# Patient Record
Sex: Female | Born: 1975 | Race: White | Hispanic: No | Marital: Married | Smoking: Never smoker
Health system: Southern US, Community
[De-identification: ages and names within clinical notes are randomized; demographics above are authoritative.]

## PROBLEM LIST (undated history)

## (undated) DIAGNOSIS — R51 Headache: Secondary | ICD-10-CM

## (undated) DIAGNOSIS — K219 Gastro-esophageal reflux disease without esophagitis: Secondary | ICD-10-CM

## (undated) DIAGNOSIS — T8859XA Other complications of anesthesia, initial encounter: Secondary | ICD-10-CM

## (undated) DIAGNOSIS — F32A Depression, unspecified: Secondary | ICD-10-CM

## (undated) DIAGNOSIS — T7840XA Allergy, unspecified, initial encounter: Secondary | ICD-10-CM

## (undated) HISTORY — DX: Gastro-esophageal reflux disease without esophagitis: K21.9

## (undated) HISTORY — PX: WISDOM TOOTH EXTRACTION: SHX21

## (undated) HISTORY — DX: Depression, unspecified: F32.A

## (undated) HISTORY — PX: APPENDECTOMY: SHX54

## (undated) HISTORY — DX: Allergy, unspecified, initial encounter: T78.40XA

## (undated) HISTORY — PX: ABDOMINAL HYSTERECTOMY: SHX81

---

## 1997-09-19 ENCOUNTER — Other Ambulatory Visit: Admission: RE | Admit: 1997-09-19 | Discharge: 1997-09-19 | Payer: Self-pay | Admitting: Obstetrics & Gynecology

## 1998-09-17 ENCOUNTER — Other Ambulatory Visit: Admission: RE | Admit: 1998-09-17 | Discharge: 1998-09-17 | Payer: Self-pay | Admitting: Obstetrics and Gynecology

## 1999-11-18 ENCOUNTER — Other Ambulatory Visit: Admission: RE | Admit: 1999-11-18 | Discharge: 1999-11-18 | Payer: Self-pay | Admitting: Obstetrics and Gynecology

## 2000-11-27 ENCOUNTER — Other Ambulatory Visit: Admission: RE | Admit: 2000-11-27 | Discharge: 2000-11-27 | Payer: Self-pay | Admitting: Obstetrics and Gynecology

## 2001-12-01 ENCOUNTER — Other Ambulatory Visit: Admission: RE | Admit: 2001-12-01 | Discharge: 2001-12-01 | Payer: Self-pay | Admitting: Obstetrics and Gynecology

## 2002-07-19 ENCOUNTER — Other Ambulatory Visit: Admission: RE | Admit: 2002-07-19 | Discharge: 2002-07-19 | Payer: Self-pay | Admitting: Obstetrics and Gynecology

## 2002-12-06 ENCOUNTER — Other Ambulatory Visit: Admission: RE | Admit: 2002-12-06 | Discharge: 2002-12-06 | Payer: Self-pay | Admitting: Obstetrics and Gynecology

## 2004-01-12 ENCOUNTER — Other Ambulatory Visit: Admission: RE | Admit: 2004-01-12 | Discharge: 2004-01-12 | Payer: Self-pay | Admitting: Obstetrics and Gynecology

## 2005-07-30 ENCOUNTER — Ambulatory Visit: Admission: RE | Admit: 2005-07-30 | Discharge: 2005-07-30 | Payer: Self-pay | Admitting: *Deleted

## 2005-09-10 ENCOUNTER — Ambulatory Visit (HOSPITAL_COMMUNITY): Admission: RE | Admit: 2005-09-10 | Discharge: 2005-09-10 | Payer: Self-pay | Admitting: Obstetrics and Gynecology

## 2005-10-25 ENCOUNTER — Inpatient Hospital Stay (HOSPITAL_COMMUNITY): Admission: AD | Admit: 2005-10-25 | Discharge: 2005-10-29 | Payer: Self-pay | Admitting: Obstetrics and Gynecology

## 2005-10-30 ENCOUNTER — Encounter: Admission: RE | Admit: 2005-10-30 | Discharge: 2005-11-29 | Payer: Self-pay | Admitting: Obstetrics and Gynecology

## 2010-09-23 ENCOUNTER — Other Ambulatory Visit: Payer: Self-pay | Admitting: Obstetrics and Gynecology

## 2011-06-03 ENCOUNTER — Other Ambulatory Visit: Payer: Self-pay | Admitting: Obstetrics and Gynecology

## 2011-06-25 ENCOUNTER — Encounter (HOSPITAL_COMMUNITY): Payer: Self-pay | Admitting: *Deleted

## 2011-06-25 ENCOUNTER — Inpatient Hospital Stay (HOSPITAL_COMMUNITY)
Admission: AD | Admit: 2011-06-25 | Discharge: 2011-06-25 | Disposition: A | Discharge status: Hospice | Payer: BC Managed Care – PPO | Source: Ambulatory Visit | Attending: Obstetrics and Gynecology | Admitting: Obstetrics and Gynecology

## 2011-06-25 DIAGNOSIS — IMO0002 Reserved for concepts with insufficient information to code with codable children: Secondary | ICD-10-CM | POA: Insufficient documentation

## 2011-06-25 HISTORY — DX: Headache: R51

## 2011-06-25 NOTE — MAU Provider Note (Signed)
Chief Complaint:  Vaginal Bleeding    First Provider Initiated Contact with Patient 06/25/11 2218      Felicia Roberson is  36 y.o. G1P0101.  No LMP recorded. Patient has had a hysterectomy..    She presents complaining of Vaginal Bleeding Pt is s/p LAVH & BSO by Dr. Henderson Cloud on 06/03/11. Reports no surgical or post-op complications until 2 hours ago when she noticed sudden onset of BRB from vagina. Reports bleeding through 2 panty liners and a regular maxi pad. States she passed a golfball sized clot in MAU bathroom. Since passing clot, bleeding has been minimal.   Denies fever, chills, nausea, vomiting, heavy lifting, recent vaginal penetration, or trauma Obstetrical/Gynecological History: OB History    Grav Para Term Preterm Abortions TAB SAB Ect Mult Living   1 1  1      1       Past Medical History: Past Medical History  Diagnosis Date  . Headache     Past Surgical History: Past Surgical History  Procedure Date  . Abdominal hysterectomy   . Cesarean section   . Appendectomy   . Wisdom tooth extraction     Family History: Family History  Problem Relation Age of Onset  . Arthritis Mother   . Hypertension Mother   . Heart disease Father   . COPD Father   . Stroke Maternal Grandmother     Social History: History  Substance Use Topics  . Smoking status: Never Smoker   . Smokeless tobacco: Not on file  . Alcohol Use: No    Allergies:  Allergies  Allergen Reactions  . Codeine Nausea And Vomiting  . Amoxicillin Rash    Prescriptions prior to admission  Medication Sig Dispense Refill  . ciprofloxacin (CIPRO) 500 MG tablet Take 500 mg by mouth 2 (two) times daily.      Marland Kitchen esomeprazole (NEXIUM) 10 MG packet Take 10 mg by mouth daily before breakfast. Acid reflux      . ibuprofen (ADVIL,MOTRIN) 200 MG tablet Take 200 mg by mouth every 6 (six) hours as needed. Pain from surgery      . oxyCODONE-acetaminophen (PERCOCET) 5-325 MG per tablet Take 1 tablet by mouth every 4  (four) hours as needed. Pain from surgery        Review of Systems - Negative except what has been reviewed in the HPI  Physical Exam   Blood pressure 123/74, pulse 74, temperature 97.9 F (36.6 C), temperature source Oral, resp. rate 18, height 5\' 3"  (1.6 m), weight 161 lb (73.029 kg), SpO2 100.00%.  General: General appearance - alert, well appearing, and in no distress and oriented to person, place, and time Mental status - alert, oriented to person, place, and time, normal mood, behavior, speech, dress, motor activity, and thought processes, affect appropriate to mood Abdomen - soft, nontender, nondistended, no masses or organomegaly Sutures: C/D/I, well healed at umbilicus and pubis Focused Gynecological Exam: VULVA: normal appearing vulva with no masses, tenderness or lesions, blood stained, VAGINA: scant dark bleeding noted, cleared with single fox swab, CERVIX: surgically absent, UTERUS: surgically absent, cuff healing well, suture intact, no evidence of active bleeding noted, ADNEXA: surgically absent bilateral  MD Consult: Discussed with Dr. Arelia Sneddon. Given hemostasis and stable state, patient to FU in office tomorrow to see Dr. Henderson Cloud.  Assessment: Post-op bleeding: Stable  Plan: Discharge home Call office in the morning for FU appt tomorrow with Dr. Pennie Banter E. 06/25/2011,10:22 PM

## 2011-06-25 NOTE — Discharge Instructions (Signed)
Postsurgical Bleeding You have developed bleeding after surgery. A little bleeding following surgery may be normal. Sometimes a second surgery to stop the bleeding is needed.  SYMPTOMS  The problems of bleeding following surgery depend on the amount and location of the bleeding:  Sometimes there is a little bleeding from a wound following surgery. This is extremely common. It is not usually problem.   Some surgeries will always have a small amount of bleeding following the surgery. An example of this would be a D & C (dilatation and curettage). This is a procedure where the inside of the uterus is scraped out. Because the surface is raw inside the uterus after the procedure, there is almost always some bleeding or oozing.   Occasionally there may be a small vessel that either breaks loose from a suture (stitch) or a vessel that was not bleeding during the procedure because of spasm in the vessel. Then when the spasm goes away after surgery, bleeding begins.   Bleeding into your brain after brain surgery happens occasionally and is very dangerous.  DIAGNOSIS  Your caregiver will often know what is wrong by examining you.  TREATMENT   The treatment of bleeding problems following surgery will depend on the amount and the location.   Your caregiver will decide if it is safe to watch this. If the problem does not get better, some additional surgery may be needed.   Worrisome bleeding may require faster action and more surgery. It may be necessary to take you immediately back to surgery if there is a sudden loss of blood pressure following surgery. There may not be time to consult with family, or even the patient, if the problems are sudden and severe.   A blood transfusion may be needed.  HOME CARE INSTRUCTIONS If you have questions about your care, discuss them with your caregiver. Make sure all of your questions are answered. SEEK MEDICAL CARE IF: Bleeding is increased, or there is increased  pain, swelling, heat or redness in the wound or you develop an unexplained temperature. Document Released: 03/14/2004 Document Revised: 12/12/2010 Document Reviewed: 11/19/2006 ExitCare Patient Information 2012 [employer]. 

## 2011-06-25 NOTE — MAU Note (Signed)
Pt had vaginal hysterectomy/laporscopic assisted on 28 May.  C/o mod amt bleeding at 1900 accompanied with low crampy abd pain.  Pt took 400mg  ibuprophen @ 1730 for mild low abd pain, that then worsened @ 1900.

## 2011-06-25 NOTE — MAU Note (Signed)
Pt reports she had a total hysterectomy on 05/28, today about 2 hours ago began having heavy vaginal bleeding. And cramping

## 2011-11-20 ENCOUNTER — Other Ambulatory Visit: Payer: Self-pay | Admitting: Obstetrics and Gynecology

## 2011-11-20 DIAGNOSIS — R1032 Left lower quadrant pain: Secondary | ICD-10-CM

## 2011-11-26 ENCOUNTER — Other Ambulatory Visit: Payer: BC Managed Care – PPO

## 2011-11-27 ENCOUNTER — Other Ambulatory Visit: Payer: BC Managed Care – PPO

## 2013-11-07 ENCOUNTER — Encounter (HOSPITAL_COMMUNITY): Payer: Self-pay | Admitting: *Deleted

## 2014-03-22 ENCOUNTER — Other Ambulatory Visit: Payer: Self-pay | Admitting: Obstetrics and Gynecology

## 2014-03-23 LAB — CYTOLOGY - PAP

## 2017-01-21 ENCOUNTER — Other Ambulatory Visit: Payer: Self-pay | Admitting: Obstetrics and Gynecology

## 2017-01-21 DIAGNOSIS — R928 Other abnormal and inconclusive findings on diagnostic imaging of breast: Secondary | ICD-10-CM

## 2017-01-30 ENCOUNTER — Ambulatory Visit
Admission: RE | Admit: 2017-01-30 | Discharge: 2017-01-30 | Disposition: A | Discharge status: Home / Self Care | Payer: Managed Care, Other (non HMO) | Source: Ambulatory Visit | Attending: Obstetrics and Gynecology | Admitting: Obstetrics and Gynecology

## 2017-01-30 DIAGNOSIS — R928 Other abnormal and inconclusive findings on diagnostic imaging of breast: Secondary | ICD-10-CM

## 2018-11-09 DIAGNOSIS — N3010 Interstitial cystitis (chronic) without hematuria: Secondary | ICD-10-CM | POA: Insufficient documentation

## 2018-11-09 DIAGNOSIS — A630 Anogenital (venereal) warts: Secondary | ICD-10-CM | POA: Insufficient documentation

## 2019-02-01 ENCOUNTER — Other Ambulatory Visit: Payer: Self-pay | Admitting: Obstetrics and Gynecology

## 2019-02-01 DIAGNOSIS — R928 Other abnormal and inconclusive findings on diagnostic imaging of breast: Secondary | ICD-10-CM

## 2019-02-11 ENCOUNTER — Other Ambulatory Visit: Payer: Self-pay | Admitting: Obstetrics and Gynecology

## 2019-02-11 ENCOUNTER — Ambulatory Visit
Admission: RE | Admit: 2019-02-11 | Discharge: 2019-02-11 | Disposition: A | Discharge status: Home / Self Care | Payer: BC Managed Care – PPO | Source: Ambulatory Visit | Attending: Obstetrics and Gynecology | Admitting: Obstetrics and Gynecology

## 2019-02-11 ENCOUNTER — Other Ambulatory Visit: Payer: Self-pay

## 2019-02-11 ENCOUNTER — Ambulatory Visit
Admission: RE | Admit: 2019-02-11 | Discharge: 2019-02-11 | Disposition: A | Discharge status: Home / Self Care | Payer: Managed Care, Other (non HMO) | Source: Ambulatory Visit | Attending: Obstetrics and Gynecology | Admitting: Obstetrics and Gynecology

## 2019-02-11 DIAGNOSIS — R928 Other abnormal and inconclusive findings on diagnostic imaging of breast: Secondary | ICD-10-CM

## 2019-02-24 ENCOUNTER — Ambulatory Visit
Admission: RE | Admit: 2019-02-24 | Discharge: 2019-02-24 | Disposition: A | Discharge status: Home / Self Care | Payer: BC Managed Care – PPO | Source: Ambulatory Visit | Attending: Obstetrics and Gynecology | Admitting: Obstetrics and Gynecology

## 2019-02-24 ENCOUNTER — Other Ambulatory Visit: Payer: Self-pay

## 2019-02-24 ENCOUNTER — Inpatient Hospital Stay: Admission: RE | Admit: 2019-02-24 | Payer: BC Managed Care – PPO | Source: Ambulatory Visit

## 2019-02-24 DIAGNOSIS — R928 Other abnormal and inconclusive findings on diagnostic imaging of breast: Secondary | ICD-10-CM

## 2019-03-18 ENCOUNTER — Ambulatory Visit: Payer: Self-pay | Admitting: Surgery

## 2019-03-18 DIAGNOSIS — N6489 Other specified disorders of breast: Secondary | ICD-10-CM

## 2019-03-18 NOTE — H&P (Signed)
Felicia Roberson Documented: 03/18/2019 9:16 AM Location: [facility] Patient #: [id number] DOB: [birth year] Married / Language: Cleophus Molt / Race: White Female  History of Present Illness Marcello Moores A. Amiera Herzberg MD; 03/18/2019 1:26 PM) Patient words: Patient sent at the request of Dr. Derrel Nip from the breast center of [facility] due to abnormal screening mammogram. The patient presents for a screening mammogram. She was found to have an area in the left breast upper outer quadrant with architectural distortion. Core biopsy showed a complex sclerosing lesion. The patient denies any history of breast pain, nipple discharge or mass in either breast. No family history of breast cancer.       CLINICAL DATA: Recall from screening mammography with tomosynthesis, possible architectural distortion involving the UPPER INNER QUADRANT of the LEFT breast at MIDDLE to POSTERIOR depth. The patient states that she has palpable thickening of the UPPER INNER QUADRANT of the LEFT breast that she has recently noticed.  EXAM: DIGITAL DIAGNOSTIC LEFT MAMMOGRAM WITH TOMO  ULTRASOUND LEFT BREAST  COMPARISON: Previous exam(s).  ACR Breast Density Category c: The breast tissue is heterogeneously dense, which may obscure small masses.  FINDINGS: Tomosynthesis and synthesized spot-compression CC and MLO views of the area of concern in the LEFT breast were obtained.  Architectural distortion persists in the [place] on the spot compression tomosynthesis images. There is no associated mass or suspicious calcifications.  On correlative physical exam, there is palpable thickening involving the UPPER INNER QUADRANT of the LEFT breast.  Targeted LEFT breast ultrasound is performed, showing benign clustered cysts at the 10 o'clock position approximately 4 cm from the nipple at POSTERIOR depth measuring approximately 0.4 x 1.1 x 1.2 cm, demonstrating posterior acoustic enhancement and no  internal power Doppler flow. This does not account for the distortion on mammography.  No convincing sonographic correlate for the distortion is identified.  Sonographic evaluation of the LEFT axilla demonstrates no pathologic lymphadenopathy.  IMPRESSION: 1. Persistent architectural distortion involving the UPPER INNER QUADRANT of the LEFT breast without sonographic correlate. 2. No pathologic LEFT axillary lymphadenopathy.  RECOMMENDATION: Stereotactic tomosynthesis core needle biopsy of the distortion in the UPPER INNER LEFT breast.  The stereotactic tomosynthesis core needle biopsy procedure was discussed with the patient and her questions were answered. She wishes to proceed, and the biopsy has been scheduled at her convenience.  I have discussed the findings and recommendations with the patient.  BI-RADS CATEGORY 4: Suspicious.        Diagnosis Breast, left, needle core biopsy, upper inner quadrant - COMPLEX SCLEROSING LESION. - FIBROCYSTIC CHANGE.  The patient is a 44 year old female.   Past Surgical History (Tanisha A. Owens Shark, [place]; 03/18/2019 9:16 AM) Appendectomy Breast Biopsy Left. Cesarean Section - 1 Hysterectomy (not due to cancer) - Complete  Diagnostic Studies History (Tanisha A. Owens Shark, [place]; 03/18/2019 9:16 AM) Colonoscopy 1-5 years ago Mammogram within last year Pap Smear 1-5 years ago  Allergies (Tanisha A. Owens Shark, [place]; 03/18/2019 9:17 AM) Amoxicillin *PENICILLINS* Codeine Phosphate *ANALGESICS - OPIOID* Allergies Reconciled  Medication History (Tanisha A. Owens Shark, [place]; 03/18/2019 9:18 AM) [place] Active. Vitamin D (50 MCG(2000 UT) Tablet, Oral) Active. Zinc (10MG  Tablet, Oral) Active. ZyrTEC Allergy (10MG  Tablet, Oral) Active. Multi-Vitamin (Oral) Active. Medications Reconciled  Social History (Tanisha A. Owens Shark, [place]; 03/18/2019 9:16 AM) No alcohol use No caffeine use No drug use Tobacco use Never smoker.  Family  History (Tanisha A. Owens Shark, [place]; 03/18/2019 9:16 AM) Arthritis Mother. Heart Disease Father. Hypertension Mother. Thyroid problems Mother.  Pregnancy / Birth History (Tanisha A. Manson Passey, RMA; 03/18/2019 9:16 AM) Age at menarche 12 years. Age of menopause <44 Contraceptive History Oral contraceptives. Gravida 1 Irregular periods Maternal age 23-30 Para 1  Other Problems (Tanisha A. Manson Passey, RMA; 03/18/2019 9:16 AM) Depression Migraine Headache     Review of Systems (Tanisha A. Brown RMA; 03/18/2019 9:16 AM) General Not Present- Appetite Loss, Chills, Fatigue, Fever, Night Sweats, Weight Gain and Weight Loss. Skin Not Present- Change in Wart/Mole, Dryness, Hives, Jaundice, New Lesions, Non-Healing Wounds, Rash and Ulcer. HEENT Present- Wears glasses/contact lenses. Not Present- Earache, Hearing Loss, Hoarseness, Nose Bleed, Oral Ulcers, Ringing in the Ears, Seasonal Allergies, Sinus Pain, Sore Throat, Visual Disturbances and Yellow Eyes. Respiratory Not Present- Bloody sputum, Chronic Cough, Difficulty Breathing, Snoring and Wheezing. Breast Present- Breast Mass. Not Present- Breast Pain, Nipple Discharge and Skin Changes. Cardiovascular Not Present- Chest Pain, Difficulty Breathing Lying Down, Leg Cramps, Palpitations, Rapid Heart Rate, Shortness of Breath and Swelling of Extremities. Gastrointestinal Not Present- Abdominal Pain, Bloating, Bloody Stool, Change in Bowel Habits, Chronic diarrhea, Constipation, Difficulty Swallowing, Excessive gas, Gets full quickly at meals, Hemorrhoids, Indigestion, Nausea, Rectal Pain and Vomiting. Female Genitourinary Not Present- Frequency, Nocturia, Painful Urination, Pelvic Pain and Urgency. Musculoskeletal Not Present- Back Pain, Joint Pain, Joint Stiffness, Muscle Pain, Muscle Weakness and Swelling of Extremities. Neurological Not Present- Decreased Memory, Fainting, Headaches, Numbness, Seizures, Tingling, Tremor, Trouble walking and  Weakness. Psychiatric Not Present- Anxiety, Bipolar, Change in Sleep Pattern, Depression, Fearful and Frequent crying. Endocrine Not Present- Cold Intolerance, Excessive Hunger, Hair Changes, Heat Intolerance, Hot flashes and New Diabetes. Hematology Not Present- Blood Thinners, Easy Bruising, Excessive bleeding, Gland problems, HIV and Persistent Infections.  Vitals (Tanisha A. Brown RMA; 03/18/2019 9:18 AM) 03/18/2019 9:18 AM Weight: 160.4 lb Height: 63in Body Surface Area: 1.76 m Body Mass Index: 28.41 kg/m  Temp.: 97.48F  Pulse: 100 (Regular)  BP: 128/82 (Sitting, Left Arm, Standard)        Physical Exam (Tryson Lumley A. Yuto Cajuste MD; 03/18/2019 1:27 PM)  General Mental Status-Alert. General Appearance-Consistent with stated age. Hydration-Well hydrated. Voice-Normal.  Head and Neck Head-normocephalic, atraumatic with no lesions or palpable masses. Trachea-midline. Thyroid Gland Characteristics - normal size and consistency.  Chest and Lung Exam Note: Clear to auscultation bilaterally. No use of accessory muscles  Breast Breast - Left-Symmetric, Non Tender, No Biopsy scars, no Dimpling - Left, No Inflammation, No Lumpectomy scars, No Mastectomy scars, No Peau d' Orange. Breast - Right-Symmetric, Non Tender, No Biopsy scars, no Dimpling - Right, No Inflammation, No Lumpectomy scars, No Mastectomy scars, No Peau d' Orange. Breast Lump-No Palpable Breast Mass.  Cardiovascular Note: Normal sinus rhythm, no JVD  Neurologic Neurologic evaluation reveals -alert and oriented x 3 with no impairment of recent or remote memory. Mental Status-Normal.  Lymphatic Head & Neck  General Head & Neck Lymphatics: Bilateral - Description - Normal. Axillary  General Axillary Region: Bilateral - Description - Normal. Tenderness - Non Tender.    Assessment & Plan (Jenilee Franey A. 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 Discussed potential upgrade risk of up to 20% with this diagnosis after core biopsy. Discussed observation as an alternative to surgery. She has opted for left breast SEED localized lumpectomy  Risk of lumpectomy include bleeding, infection, seroma, more surgery, use of seed/wire, wound care, cosmetic deformity and the need for other treatments, death , blood clots, death. Pt agrees to proceed.  Total time 45 minutes to review records, face-to-face time, Dr. dictation, review pathology, reviewed mammograms, review record documentation and discussed surgery with complications, long-term expectations and outcomes.  Current Plans You are being scheduled for surgery- Our schedulers will call you.  You should hear from our office's scheduling department within 5 working days about the location, date, and time of surgery. We try to make accommodations for patient's preferences in scheduling surgery, but sometimes the OR schedule or the surgeon's schedule prevents [place] from making those accommodations.  If you have not heard from our office [phone]) in 5 working days, call the office and ask for your surgeon's nurse.  If you have other questions about your diagnosis, plan, or surgery, call the office and ask for your surgeon's nurse.  Pt Education - [facility] Breast Biopsy HCI: discussed with patient and provided information.

## 2019-04-28 ENCOUNTER — Other Ambulatory Visit: Payer: Self-pay | Admitting: Surgery

## 2019-04-28 DIAGNOSIS — N6489 Other specified disorders of breast: Secondary | ICD-10-CM

## 2019-06-22 ENCOUNTER — Other Ambulatory Visit: Payer: Self-pay

## 2019-06-22 ENCOUNTER — Encounter (HOSPITAL_BASED_OUTPATIENT_CLINIC_OR_DEPARTMENT_OTHER): Payer: Self-pay | Admitting: Surgery

## 2019-06-27 ENCOUNTER — Encounter (HOSPITAL_BASED_OUTPATIENT_CLINIC_OR_DEPARTMENT_OTHER)
Admission: RE | Admit: 2019-06-27 | Discharge: 2019-06-27 | Disposition: A | Discharge status: Home / Self Care | Payer: BC Managed Care – PPO | Source: Ambulatory Visit | Attending: Surgery | Admitting: Surgery

## 2019-06-27 ENCOUNTER — Other Ambulatory Visit (HOSPITAL_COMMUNITY)
Admission: RE | Admit: 2019-06-27 | Discharge: 2019-06-27 | Disposition: A | Discharge status: Home / Self Care | Payer: BC Managed Care – PPO | Source: Ambulatory Visit | Attending: Surgery | Admitting: Surgery

## 2019-06-27 DIAGNOSIS — Z01812 Encounter for preprocedural laboratory examination: Secondary | ICD-10-CM | POA: Insufficient documentation

## 2019-06-27 DIAGNOSIS — Z20822 Contact with and (suspected) exposure to covid-19: Secondary | ICD-10-CM | POA: Diagnosis not present

## 2019-06-27 LAB — CBC WITH DIFFERENTIAL/PLATELET
Abs Immature Granulocytes: 0.01 10*3/uL (ref 0.00–0.07)
Basophils Absolute: 0 10*3/uL (ref 0.0–0.1)
Basophils Relative: 1 %
Eosinophils Absolute: 0.2 10*3/uL (ref 0.0–0.5)
Eosinophils Relative: 3 %
HCT: 43.1 % (ref 36.0–46.0)
Hemoglobin: 14.3 g/dL (ref 12.0–15.0)
Immature Granulocytes: 0 %
Lymphocytes Relative: 27 %
Lymphs Abs: 1.7 10*3/uL (ref 0.7–4.0)
MCH: 31.9 pg (ref 26.0–34.0)
MCHC: 33.2 g/dL (ref 30.0–36.0)
MCV: 96.2 fL (ref 80.0–100.0)
Monocytes Absolute: 0.5 10*3/uL (ref 0.1–1.0)
Monocytes Relative: 8 %
Neutro Abs: 3.9 10*3/uL (ref 1.7–7.7)
Neutrophils Relative %: 61 %
Platelets: 235 10*3/uL (ref 150–400)
RBC: 4.48 MIL/uL (ref 3.87–5.11)
RDW: 12.2 % (ref 11.5–15.5)
WBC: 6.3 10*3/uL (ref 4.0–10.5)
nRBC: 0 % (ref 0.0–0.2)

## 2019-06-27 LAB — COMPREHENSIVE METABOLIC PANEL
ALT: 55 U/L — ABNORMAL HIGH (ref 0–44)
AST: 24 U/L (ref 15–41)
Albumin: 4 g/dL (ref 3.5–5.0)
Alkaline Phosphatase: 60 U/L (ref 38–126)
Anion gap: 7 (ref 5–15)
BUN: 16 mg/dL (ref 6–20)
CO2: 27 mmol/L (ref 22–32)
Calcium: 9.5 mg/dL (ref 8.9–10.3)
Chloride: 106 mmol/L (ref 98–111)
Creatinine, Ser: 0.88 mg/dL (ref 0.44–1.00)
GFR calc Af Amer: >60 mL/min (ref >60)
GFR calc non Af Amer: >60 mL/min (ref >60)
Glucose, Bld: 100 mg/dL — ABNORMAL HIGH (ref 70–99)
Potassium: 4.2 mmol/L (ref 3.5–5.1)
Sodium: 140 mmol/L (ref 135–145)
Total Bilirubin: 0.5 mg/dL (ref 0.3–1.2)
Total Protein: 6.7 g/dL (ref 6.5–8.1)

## 2019-06-27 LAB — SARS CORONAVIRUS 2 (TAT 6-24 HRS): SARS Coronavirus 2: NEGATIVE

## 2019-06-28 ENCOUNTER — Ambulatory Visit: Payer: Self-pay | Admitting: Surgery

## 2019-06-28 DIAGNOSIS — N6489 Other specified disorders of breast: Secondary | ICD-10-CM

## 2019-06-28 NOTE — Progress Notes (Signed)

## 2019-06-29 ENCOUNTER — Other Ambulatory Visit: Payer: Self-pay

## 2019-06-29 ENCOUNTER — Ambulatory Visit
Admission: RE | Admit: 2019-06-29 | Discharge: 2019-06-29 | Disposition: A | Discharge status: Home / Self Care | Payer: BC Managed Care – PPO | Source: Ambulatory Visit | Attending: Surgery | Admitting: Surgery

## 2019-06-29 DIAGNOSIS — N6489 Other specified disorders of breast: Secondary | ICD-10-CM

## 2019-06-30 ENCOUNTER — Ambulatory Visit
Admission: RE | Admit: 2019-06-30 | Discharge: 2019-06-30 | Disposition: A | Discharge status: Home / Self Care | Payer: BC Managed Care – PPO | Source: Ambulatory Visit | Attending: Surgery | Admitting: Surgery

## 2019-06-30 ENCOUNTER — Ambulatory Visit (HOSPITAL_BASED_OUTPATIENT_CLINIC_OR_DEPARTMENT_OTHER)
Admission: RE | Admit: 2019-06-30 | Discharge: 2019-06-30 | Disposition: A | Discharge status: Home / Self Care | Payer: BC Managed Care – PPO | Attending: Surgery | Admitting: Surgery

## 2019-06-30 ENCOUNTER — Encounter (HOSPITAL_BASED_OUTPATIENT_CLINIC_OR_DEPARTMENT_OTHER)
Admission: RE | Disposition: A | Discharge status: Home / Self Care | Payer: Self-pay | Source: Home / Self Care | Attending: Surgery

## 2019-06-30 ENCOUNTER — Encounter (HOSPITAL_BASED_OUTPATIENT_CLINIC_OR_DEPARTMENT_OTHER): Payer: Self-pay | Admitting: Surgery

## 2019-06-30 ENCOUNTER — Ambulatory Visit (HOSPITAL_BASED_OUTPATIENT_CLINIC_OR_DEPARTMENT_OTHER): Payer: BC Managed Care – PPO | Admitting: Certified Registered"

## 2019-06-30 ENCOUNTER — Other Ambulatory Visit: Payer: Self-pay

## 2019-06-30 DIAGNOSIS — N6012 Diffuse cystic mastopathy of left breast: Secondary | ICD-10-CM | POA: Diagnosis not present

## 2019-06-30 DIAGNOSIS — Z880 Allergy status to penicillin: Secondary | ICD-10-CM | POA: Insufficient documentation

## 2019-06-30 DIAGNOSIS — N6022 Fibroadenosis of left breast: Secondary | ICD-10-CM | POA: Diagnosis not present

## 2019-06-30 DIAGNOSIS — N6489 Other specified disorders of breast: Secondary | ICD-10-CM

## 2019-06-30 DIAGNOSIS — Z885 Allergy status to narcotic agent status: Secondary | ICD-10-CM | POA: Diagnosis not present

## 2019-06-30 DIAGNOSIS — Z886 Allergy status to analgesic agent status: Secondary | ICD-10-CM | POA: Insufficient documentation

## 2019-06-30 HISTORY — DX: Other complications of anesthesia, initial encounter: T88.59XA

## 2019-06-30 HISTORY — PX: BREAST LUMPECTOMY WITH RADIOACTIVE SEED LOCALIZATION: SHX6424

## 2019-06-30 SURGERY — BREAST LUMPECTOMY WITH RADIOACTIVE SEED LOCALIZATION
Anesthesia: General | Site: Breast | Laterality: Left

## 2019-06-30 MED ORDER — OXYCODONE HCL 5 MG/5ML PO SOLN: 5.0000 mg | Freq: Once | ORAL | Status: DC | PRN

## 2019-06-30 MED ORDER — LIDOCAINE HCL (CARDIAC) PF 100 MG/5ML IV SOSY
PREFILLED_SYRINGE | INTRAVENOUS | Status: DC | PRN
  Administered 2019-06-30: 60 mg via INTRAVENOUS

## 2019-06-30 MED ORDER — BUPIVACAINE HCL (PF) 0.5 % IJ SOLN
INTRAMUSCULAR | Status: AC
  Filled 2019-06-30: qty 30

## 2019-06-30 MED ORDER — IBUPROFEN 800 MG PO TABS
800.0000 mg | ORAL_TABLET | Freq: Three times a day (TID) | ORAL | 0 refills | Status: DC | PRN
Start: 2019-06-30 — End: 2020-08-27

## 2019-06-30 MED ORDER — AMISULPRIDE (ANTIEMETIC) 5 MG/2ML IV SOLN: 10.0000 mg | Freq: Once | INTRAVENOUS | Status: DC | PRN

## 2019-06-30 MED ORDER — HYDROMORPHONE HCL 1 MG/ML IJ SOLN
INTRAMUSCULAR | Status: AC
  Filled 2019-06-30: qty 0.5

## 2019-06-30 MED ORDER — PROPOFOL 500 MG/50ML IV EMUL
INTRAVENOUS | Status: AC
  Filled 2019-06-30: qty 50

## 2019-06-30 MED ORDER — MIDAZOLAM HCL 5 MG/5ML IJ SOLN
INTRAMUSCULAR | Status: DC | PRN
  Administered 2019-06-30: 2 mg via INTRAVENOUS

## 2019-06-30 MED ORDER — HYDROMORPHONE HCL 1 MG/ML IJ SOLN
0.2500 mg | INTRAMUSCULAR | Status: DC | PRN
  Administered 2019-06-30: 0.5 mg via INTRAVENOUS

## 2019-06-30 MED ORDER — CLINDAMYCIN PHOSPHATE 900 MG/50ML IV SOLN
INTRAVENOUS | Status: AC
  Filled 2019-06-30: qty 50

## 2019-06-30 MED ORDER — CELECOXIB 200 MG PO CAPS
ORAL_CAPSULE | ORAL | Status: AC
  Filled 2019-06-30: qty 1

## 2019-06-30 MED ORDER — CHLORHEXIDINE GLUCONATE CLOTH 2 % EX PADS: 6.0000 | MEDICATED_PAD | Freq: Once | CUTANEOUS | Status: DC

## 2019-06-30 MED ORDER — EPHEDRINE SULFATE 50 MG/ML IJ SOLN
INTRAMUSCULAR | Status: DC | PRN
  Administered 2019-06-30: 10 mg via INTRAVENOUS
  Administered 2019-06-30: 5 mg via INTRAVENOUS

## 2019-06-30 MED ORDER — PROMETHAZINE HCL 25 MG/ML IJ SOLN: 6.2500 mg | INTRAMUSCULAR | Status: DC | PRN

## 2019-06-30 MED ORDER — BUPIVACAINE HCL (PF) 0.25 % IJ SOLN
INTRAMUSCULAR | Status: AC
  Filled 2019-06-30: qty 30

## 2019-06-30 MED ORDER — FENTANYL CITRATE (PF) 100 MCG/2ML IJ SOLN
INTRAMUSCULAR | Status: AC
  Filled 2019-06-30: qty 2

## 2019-06-30 MED ORDER — CLINDAMYCIN PHOSPHATE 900 MG/50ML IV SOLN
900.0000 mg | INTRAVENOUS | Status: AC
  Administered 2019-06-30: 900 mg via INTRAVENOUS

## 2019-06-30 MED ORDER — ACETAMINOPHEN 500 MG PO TABS
1000.0000 mg | ORAL_TABLET | ORAL | Status: AC
  Administered 2019-06-30: 1000 mg via ORAL

## 2019-06-30 MED ORDER — FENTANYL CITRATE (PF) 100 MCG/2ML IJ SOLN
INTRAMUSCULAR | Status: DC | PRN
  Administered 2019-06-30 (×2): 25 ug via INTRAVENOUS

## 2019-06-30 MED ORDER — ONDANSETRON HCL 4 MG/2ML IJ SOLN
INTRAMUSCULAR | Status: DC | PRN
  Administered 2019-06-30: 4 mg via INTRAVENOUS

## 2019-06-30 MED ORDER — OXYCODONE HCL 5 MG PO TABS: 5.0000 mg | ORAL_TABLET | Freq: Once | ORAL | Status: DC | PRN

## 2019-06-30 MED ORDER — BUPIVACAINE HCL (PF) 0.5 % IJ SOLN
INTRAMUSCULAR | Status: DC | PRN
  Administered 2019-06-30: 20 mL

## 2019-06-30 MED ORDER — ACETAMINOPHEN 500 MG PO TABS
ORAL_TABLET | ORAL | Status: AC
  Filled 2019-06-30: qty 2

## 2019-06-30 MED ORDER — DEXAMETHASONE SODIUM PHOSPHATE 10 MG/ML IJ SOLN
INTRAMUSCULAR | Status: DC | PRN
  Administered 2019-06-30: 4 mg via INTRAVENOUS

## 2019-06-30 MED ORDER — HYDROCODONE-ACETAMINOPHEN 5-325 MG PO TABS
1.0000 | ORAL_TABLET | Freq: Four times a day (QID) | ORAL | 0 refills | Status: DC | PRN
Start: 2019-06-30 — End: 2020-08-27

## 2019-06-30 MED ORDER — GABAPENTIN 300 MG PO CAPS
300.0000 mg | ORAL_CAPSULE | ORAL | Status: AC
  Administered 2019-06-30: 300 mg via ORAL

## 2019-06-30 MED ORDER — GABAPENTIN 300 MG PO CAPS
ORAL_CAPSULE | ORAL | Status: AC
  Filled 2019-06-30: qty 1

## 2019-06-30 MED ORDER — LACTATED RINGERS IV SOLN: INTRAVENOUS | Status: DC

## 2019-06-30 MED ORDER — CELECOXIB 200 MG PO CAPS
200.0000 mg | ORAL_CAPSULE | ORAL | Status: AC
  Administered 2019-06-30: 200 mg via ORAL

## 2019-06-30 MED ORDER — MEPERIDINE HCL 25 MG/ML IJ SOLN: 6.2500 mg | INTRAMUSCULAR | Status: DC | PRN

## 2019-06-30 MED ORDER — PROPOFOL 10 MG/ML IV BOLUS
INTRAVENOUS | Status: DC | PRN
  Administered 2019-06-30: 200 mg via INTRAVENOUS

## 2019-06-30 MED ORDER — MIDAZOLAM HCL 2 MG/2ML IJ SOLN
INTRAMUSCULAR | Status: AC
  Filled 2019-06-30: qty 2

## 2019-06-30 SURGICAL SUPPLY — 38 items
ADH SKN CLS APL DERMABOND .7 (GAUZE/BANDAGES/DRESSINGS) ×1
APL PRP STRL LF DISP 70% ISPRP (MISCELLANEOUS) ×1
BINDER BREAST XLRG (GAUZE/BANDAGES/DRESSINGS) ×2 IMPLANT
BLADE SURG 15 STRL LF DISP TIS (BLADE) ×1 IMPLANT
BLADE SURG 15 STRL SS (BLADE) ×3
CHLORAPREP W/TINT 26 (MISCELLANEOUS) ×3 IMPLANT
COVER BACK TABLE 60X90IN (DRAPES) ×3 IMPLANT
COVER MAYO STAND STRL (DRAPES) ×3 IMPLANT
COVER PROBE W GEL 5X96 (DRAPES) ×3 IMPLANT
DERMABOND ADVANCED (GAUZE/BANDAGES/DRESSINGS) ×2
DERMABOND ADVANCED .7 DNX12 (GAUZE/BANDAGES/DRESSINGS) ×1 IMPLANT
DRAPE LAPAROTOMY 100X72 PEDS (DRAPES) ×3 IMPLANT
DRAPE UTILITY XL STRL (DRAPES) ×3 IMPLANT
ELECT COATED BLADE 2.86 ST (ELECTRODE) ×3 IMPLANT
ELECT REM PT RETURN 9FT ADLT (ELECTROSURGICAL) ×3
ELECTRODE REM PT RTRN 9FT ADLT (ELECTROSURGICAL) ×1 IMPLANT
GLOVE BIO SURGEON STRL SZ 6.5 (GLOVE) ×2 IMPLANT
GLOVE BIO SURGEONS STRL SZ 6.5 (GLOVE) ×2
GLOVE BIOGEL PI IND STRL 6.5 (GLOVE) ×3 IMPLANT
GLOVE BIOGEL PI IND STRL 8 (GLOVE) ×1 IMPLANT
GLOVE BIOGEL PI INDICATOR 6.5 (GLOVE) ×6
GLOVE BIOGEL PI INDICATOR 8 (GLOVE) ×2
GLOVE ECLIPSE 8.0 STRL XLNG CF (GLOVE) ×3 IMPLANT
GOWN STRL REUS W/ TWL LRG LVL3 (GOWN DISPOSABLE) ×2 IMPLANT
GOWN STRL REUS W/TWL LRG LVL3 (GOWN DISPOSABLE) ×6
KIT MARKER MARGIN INK (KITS) ×3 IMPLANT
NDL HYPO 25X1 1.5 SAFETY (NEEDLE) ×1 IMPLANT
NEEDLE HYPO 25X1 1.5 SAFETY (NEEDLE) ×3 IMPLANT
NS IRRIG 1000ML POUR BTL (IV SOLUTION) ×3 IMPLANT
PENCIL SMOKE EVACUATOR (MISCELLANEOUS) ×3 IMPLANT
SET BASIN DAY SURGERY F.S. (CUSTOM PROCEDURE TRAY) ×3 IMPLANT
SLEEVE SCD COMPRESS KNEE MED (MISCELLANEOUS) ×3 IMPLANT
SPONGE LAP 4X18 RFD (DISPOSABLE) ×5 IMPLANT
SUT MNCRL AB 4-0 PS2 18 (SUTURE) ×3 IMPLANT
SUT VICRYL 3-0 CR8 SH (SUTURE) ×3 IMPLANT
SYR CONTROL 10ML LL (SYRINGE) ×3 IMPLANT
TOWEL GREEN STERILE FF (TOWEL DISPOSABLE) ×3 IMPLANT
TRAY FAXITRON CT DISP (TRAY / TRAY PROCEDURE) ×3 IMPLANT

## 2019-06-30 NOTE — Anesthesia Preprocedure Evaluation (Signed)
Anesthesia Evaluation  Patient identified by MRN, date of birth, ID band Patient awake    Reviewed: Allergy & Precautions, NPO status , Patient's Chart, lab work & pertinent test results  Airway Mallampati: II  TM Distance: >3 FB Neck ROM: Full    Dental no notable dental hx.    Pulmonary neg pulmonary ROS,    Pulmonary exam normal breath sounds clear to auscultation       Cardiovascular negative cardio ROS Normal cardiovascular exam Rhythm:Regular Rate:Normal     Neuro/Psych  Headaches, negative psych ROS   GI/Hepatic negative GI ROS, Neg liver ROS,   Endo/Other  negative endocrine ROS  Renal/GU negative Renal ROS  negative genitourinary   Musculoskeletal negative musculoskeletal ROS (+)   Abdominal   Peds negative pediatric ROS (+)  Hematology negative hematology ROS (+)   Anesthesia Other Findings   Reproductive/Obstetrics negative OB ROS                             Anesthesia Physical Anesthesia Plan  ASA: II  Anesthesia Plan: General   Post-op Pain Management:    Induction: Intravenous  PONV Risk Score and Plan: 3 and Ondansetron, Dexamethasone, Midazolam and Treatment may vary due to age or medical condition  Airway Management Planned: LMA  Additional Equipment:   Intra-op Plan:   Post-operative Plan: Extubation in OR  Informed Consent: I have reviewed the patients History and Physical, chart, labs and discussed the procedure including the risks, benefits and alternatives for the proposed anesthesia with the patient or authorized representative who has indicated his/her understanding and acceptance.     Dental advisory given  Plan Discussed with: CRNA  Anesthesia Plan Comments:         Anesthesia Quick Evaluation

## 2019-06-30 NOTE — Transfer of Care (Signed)
Immediate Anesthesia Transfer of Care Note  Patient: Felicia Roberson  Procedure(s) Performed: LEFT BREAST LUMPECTOMY WITH RADIOACTIVE SEED LOCALIZATION (Left Breast)  Patient Location: PACU  Anesthesia Type:General  Level of Consciousness: drowsy  Airway & Oxygen Therapy: Patient Spontanous Breathing and Patient connected to nasal cannula oxygen  Post-op Assessment: Report given to RN and Post -op Vital signs reviewed and stable  Post vital signs: Reviewed and stable  Last Vitals:  Vitals Value Taken Time  BP 115/72 06/30/19 0900  Temp 36.4 C 06/30/19 0857  Pulse 84 06/30/19 0900  Resp 12 06/30/19 0900  SpO2 100 % 06/30/19 0900  Vitals shown include unvalidated device data.  Last Pain:  Vitals:   06/30/19 0703  TempSrc: Oral  PainSc: 0-No pain      Patients Stated Pain Goal: 3 (06/30/19 0703)  Complications: No complications documented.

## 2019-06-30 NOTE — Discharge Instructions (Signed)
[facility] Office Phone Number [phone]  BREAST BIOPSY/ PARTIAL MASTECTOMY: POST OP INSTRUCTIONS  Always review your discharge instruction sheet given to you by the facility where your surgery was performed.  IF YOU HAVE DISABILITY OR FAMILY LEAVE FORMS, YOU MUST BRING THEM TO THE OFFICE FOR PROCESSING.  DO NOT GIVE THEM TO YOUR DOCTOR.  1. A prescription for pain medication may be given to you upon discharge.  Take your pain medication as prescribed, if needed.  If narcotic pain medicine is not needed, then you may take acetaminophen (Tylenol) or ibuprofen (Advil) as needed. 2. Take your usually prescribed medications unless otherwise directed 3. If you need a refill on your pain medication, please contact your pharmacy.  They will contact our office to request authorization.  Prescriptions will not be filled after 5pm or on week-ends. 4. You should eat very light the first 24 hours after surgery, such as soup, crackers, pudding, etc.  Resume your normal diet the day after surgery. 5. Most patients will experience some swelling and bruising in the breast.  Ice packs and a good support bra will help.  Swelling and bruising can take several days to resolve.  6. It is common to experience some constipation if taking pain medication after surgery.  Increasing fluid intake and taking a stool softener will usually help or prevent this problem from occurring.  A mild laxative (Milk of Magnesia or Miralax) should be taken according to package directions if there are no bowel movements after 48 hours. 7. Unless discharge instructions indicate otherwise, you may remove your bandages 24-48 hours after surgery, and you may shower at that time.  You may have steri-strips (small skin tapes) in place directly over the incision.  These strips should be left on the skin for 7-10 days.  If your surgeon used skin glue on the incision, you may shower in 24 hours.  The glue will flake off over the  next 2-3 weeks.  Any sutures or staples will be removed at the office during your follow-up visit. 8. ACTIVITIES:  You may resume regular daily activities (gradually increasing) beginning the next day.  Wearing a good support bra or sports bra minimizes pain and swelling.  You may have sexual intercourse when it is comfortable. a. You may drive when you no longer are taking prescription pain medication, you can comfortably wear a seatbelt, and you can safely maneuver your car and apply brakes. b. RETURN TO WORK:  ______________________________________________________________________________________ 9. You should see your doctor in the office for a follow-up appointment approximately two weeks after your surgery.  Your doctor's nurse will typically make your follow-up appointment when she calls you with your pathology report.  Expect your pathology report 2-3 business days after your surgery.  You may call to check if you do not hear from [place] after three days. 10. OTHER INSTRUCTIONS: _______________________________________________________________________________________________ _____________________________________________________________________________________________________________________________________ _____________________________________________________________________________________________________________________________________ _____________________________________________________________________________________________________________________________________  WHEN TO CALL YOUR DOCTOR: 1. Fever over 101.0 2. Nausea and/or vomiting. 3. Extreme swelling or bruising. 4. Continued bleeding from incision. 5. Increased pain, redness, or drainage from the incision.  The clinic staff is available to answer your questions during regular business hours.  Please don't hesitate to call and ask to speak to one of the nurses for clinical concerns.  If you have a medical emergency, go to the nearest  emergency room or call 911.  A surgeon from [facility] is always on call at the hospital.  For further questions, please visit [email or web address]  Post Anesthesia Home Care Instructions  Activity: Get plenty of rest for the remainder of the day. A responsible individual must stay with you for 24 hours following the procedure.  For the next 24 hours, DO NOT: -Drive a car -Advertising copywriter -Drink alcoholic beverages -Take any medication unless instructed by your physician -Make any legal decisions or sign important papers.  Meals: Start with liquid foods such as gelatin or soup. Progress to regular foods as tolerated. Avoid greasy, spicy, heavy foods. If nausea and/or vomiting occur, drink only clear liquids until the nausea and/or vomiting subsides. Call your physician if vomiting continues.  Special Instructions/Symptoms: Your throat may feel dry or sore from the anesthesia or the breathing tube placed in your throat during surgery. If this causes discomfort, gargle with warm salt water. The discomfort should disappear within 24 hours.  *May have Tylenol after 1pm today 06/30/19 *May have Ibuprofen after 3pm today 06/30/19

## 2019-06-30 NOTE — Anesthesia Postprocedure Evaluation (Signed)
Anesthesia Post Note  Patient: Felicia Roberson  Procedure(s) Performed: LEFT BREAST LUMPECTOMY WITH RADIOACTIVE SEED LOCALIZATION (Left Breast)     Patient location during evaluation: PACU Anesthesia Type: General Level of consciousness: awake and alert Pain management: pain level controlled Vital Signs Assessment: post-procedure vital signs reviewed and stable Respiratory status: spontaneous breathing, nonlabored ventilation and respiratory function stable Cardiovascular status: blood pressure returned to baseline and stable Postop Assessment: no apparent nausea or vomiting Anesthetic complications: no   No complications documented.  Last Vitals:  Vitals:   06/30/19 0930 06/30/19 0956  BP:  119/76  Pulse: 84 70  Resp: 18 16  Temp:  (!) 36.4 C  SpO2: 100% 99%    Last Pain:  Vitals:   06/30/19 0956  TempSrc: Oral  PainSc: 2                  Lowella Curb

## 2019-06-30 NOTE — Interval H&P Note (Signed)
History and Physical Interval Note:  06/30/2019 8:00 AM  Felicia Roberson  has presented today for surgery, with the diagnosis of LEFT BREAST RADIAL SCAR.  The various methods of treatment have been discussed with the patient and family. After consideration of risks, benefits and other options for treatment, the patient has consented to  Procedure(s): LEFT BREAST LUMPECTOMY WITH RADIOACTIVE SEED LOCALIZATION (Left) as a surgical intervention.  The patient's history has been reviewed, patient examined, no change in status, stable for surgery.  I have reviewed the patient's chart and labs.  Questions were answered to the patient's satisfaction.     Sianne Tejada A Rielyn Krupinski

## 2019-06-30 NOTE — Anesthesia Procedure Notes (Signed)
Procedure Name: LMA Insertion Date/Time: 06/30/2019 8:10 AM Performed by: Lauralyn Primes, CRNA Pre-anesthesia Checklist: Patient identified, Emergency Drugs available, Suction available and Patient being monitored Patient Re-evaluated:Patient Re-evaluated prior to induction Oxygen Delivery Method: Circle system utilized Preoxygenation: Pre-oxygenation with 100% oxygen Induction Type: IV induction Ventilation: Mask ventilation without difficulty LMA: LMA inserted LMA Size: 4.0 Number of attempts: 1 Airway Equipment and Method: Bite block Placement Confirmation: positive ETCO2 Tube secured with: Tape Dental Injury: Teeth and Oropharynx as per pre-operative assessment

## 2019-06-30 NOTE — H&P (Signed)
Felicia Roberson  Location: [facility] Patient #: [id number] DOB: 05-29-1975 Married / Language: English / Race: White Female  History of Present Illness  Patient words: Patient sent at the request of Dr. Micheline Maze from the breast center of [facility] due to abnormal screening mammogram. The patient presents for a screening mammogram. She was found to have an area in the left breast upper outer quadrant with architectural distortion. Core biopsy showed a complex sclerosing lesion. The patient denies any history of breast pain, nipple discharge or mass in either breast. No family history of breast cancer.       CLINICAL DATA: Recall from screening mammography with tomosynthesis, possible architectural distortion involving the UPPER INNER QUADRANT of the LEFT breast at MIDDLE to POSTERIOR depth. The patient states that she has palpable thickening of the UPPER INNER QUADRANT of the LEFT breast that she has recently noticed.  EXAM: DIGITAL DIAGNOSTIC LEFT MAMMOGRAM WITH TOMO  ULTRASOUND LEFT BREAST  COMPARISON: Previous exam(s).  ACR Breast Density Category c: The breast tissue is heterogeneously dense, which may obscure small masses.  FINDINGS: Tomosynthesis and synthesized spot-compression CC and MLO views of the area of concern in the LEFT breast were obtained.  Architectural distortion persists in the UPPER INNER QUADRANT on the spot compression tomosynthesis images. There is no associated mass or suspicious calcifications.  On correlative physical exam, there is palpable thickening involving the UPPER INNER QUADRANT of the LEFT breast.  Targeted LEFT breast ultrasound is performed, showing benign clustered cysts at the 10 o'clock position approximately 4 cm from the nipple at POSTERIOR depth measuring approximately 0.4 x 1.1 x 1.2 cm, demonstrating posterior acoustic enhancement and no internal power Doppler flow. This does not account for the  distortion on mammography.  No convincing sonographic correlate for the distortion is identified.  Sonographic evaluation of the LEFT axilla demonstrates no pathologic lymphadenopathy.  IMPRESSION: 1. Persistent architectural distortion involving the UPPER INNER QUADRANT of the LEFT breast without sonographic correlate. 2. No pathologic LEFT axillary lymphadenopathy.  RECOMMENDATION: Stereotactic tomosynthesis core needle biopsy of the distortion in the UPPER INNER LEFT breast.  The stereotactic tomosynthesis core needle biopsy procedure was discussed with the patient and her questions were answered. She wishes to proceed, and the biopsy has been scheduled at her convenience.  I have discussed the findings and recommendations with the patient.  BI-RADS CATEGORY 4: Suspicious.        Diagnosis Breast, left, needle core biopsy, upper inner quadrant - COMPLEX SCLEROSING LESION. - FIBROCYSTIC CHANGE.  The patient is a 44 year old female.   Past Surgical History  Appendectomy Breast Biopsy Left. Cesarean Section - 1 Hysterectomy (not due to cancer) - Complete  Diagnostic Studies History ( AM) Colonoscopy 1-5 years ago Mammogram within last year Pap Smear 1-5 years ago  Allergies  Amoxicillin *PENICILLINS* Codeine Phosphate *ANALGESICS - OPIOID* Allergies Reconciled  Medication History  Troche Base Active. Vitamin D (50 MCG(2000 UT) Tablet, Oral) Active. Zinc (10MG  Tablet, Oral) Active. ZyrTEC Allergy (10MG  Tablet, Oral) Active. Multi-Vitamin (Oral) Active. Medications Reconciled  Social HistoryNo alcohol use No caffeine use No drug use Tobacco use Never smoker.  Family HistoryArthritis Mother. Heart Disease Father. Hypertension Mother. Thyroid problems Mother.  Pregnancy / Birth History  AM) Age at menarche 12 years. Age of menopause <44 Contraceptive History Oral contraceptives. Gravida  1 Irregular periods Maternal age 73-30 Para 1  Other Problems (M) Depression Migraine Headache     Review of Systems General Not Present- Appetite Loss,  Chills, Fatigue, Fever, Night Sweats, Weight Gain and Weight Loss. Skin Not Present- Change in Wart/Mole, Dryness, Hives, Jaundice, New Lesions, Non-Healing Wounds, Rash and Ulcer. HEENT Present- Wears glasses/contact lenses. Not Present- Earache, Hearing Loss, Hoarseness, Nose Bleed, Oral Ulcers, Ringing in the Ears, Seasonal Allergies, Sinus Pain, Sore Throat, Visual Disturbances and Yellow Eyes. Respiratory Not Present- Bloody sputum, Chronic Cough, Difficulty Breathing, Snoring and Wheezing. Breast Present- Breast Mass. Not Present- Breast Pain, Nipple Discharge and Skin Changes. Cardiovascular Not Present- Chest Pain, Difficulty Breathing Lying Down, Leg Cramps, Palpitations, Rapid Heart Rate, Shortness of Breath and Swelling of Extremities. Gastrointestinal Not Present- Abdominal Pain, Bloating, Bloody Stool, Change in Bowel Habits, Chronic diarrhea, Constipation, Difficulty Swallowing, Excessive gas, Gets full quickly at meals, Hemorrhoids, Indigestion, Nausea, Rectal Pain and Vomiting. Female Genitourinary Not Present- Frequency, Nocturia, Painful Urination, Pelvic Pain and Urgency. Musculoskeletal Not Present- Back Pain, Joint Pain, Joint Stiffness, Muscle Pain, Muscle Weakness and Swelling of Extremities. Neurological Not Present- Decreased Memory, Fainting, Headaches, Numbness, Seizures, Tingling, Tremor, Trouble walking and Weakness. Psychiatric Not Present- Anxiety, Bipolar, Change in Sleep Pattern, Depression, Fearful and Frequent crying. Endocrine Not Present- Cold Intolerance, Excessive Hunger, Hair Changes, Heat Intolerance, Hot flashes and New Diabetes. Hematology Not Present- Blood Thinners, Easy Bruising, Excessive bleeding, Gland problems, HIV and Persistent Infanisha A. Brown RMA; 03/18/2019 9:18 AM) 03/18/2019  9:18 AM Weight: 160.4 lb Height: 63in Body Surface Area: 1.76 m Body Mass Index: 28.41 kg/m  Temp.: 97.26F  Pulse: 100 (Regular)  BP: 128/82 (Sitting, Left Arm, Standard)        Physical Exam   General Mental Status-Alert. General Appearance-Consistent with stated age. Hydration-Well hydrated. Voice-Normal.  Head and Neck Head-normocephalic, atraumatic with no lesions or palpable masses. Trachea-midline. Thyroid Gland Characteristics - normal size and consistency.  Chest and Lung Exam Note: Clear to auscultation bilaterally. No use of accessory muscles  Breast Breast - Left-Symmetric, Non Tender, No Biopsy scars, no Dimpling - Left, No Inflammation, No Lumpectomy scars, No Mastectomy scars, No Peau d' Orange. Breast - Right-Symmetric, Non Tender, No Biopsy scars, no Dimpling - Right, No Inflammation, No Lumpectomy scars, No Mastectomy scars, No Peau d' Orange. Breast Lump-No Palpable Breast Mass.  Cardiovascular Note: Normal sinus rhythm, no JVD  Neurologic Neurologic evaluation reveals -alert and oriented x 3 with no impairment of recent or remote memory. Mental Status-Normal.  Lymphatic Head & Neck  General Head & Neck Lymphatics: Bilateral - Description - Normal. Axillary  General Axillary Region: Bilateral - Description - Normal. Tenderness - Non Tender.    Assessment & Plan (Latha Staunton A. 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 Discussed potential upgrade risk of up to 20% with this diagnosis after core biopsy. Discussed observation as an alternative to surgery. She has opted for left breast SEED localized lumpectomy  Risk of lumpectomy include bleeding, infection, seroma, more surgery, use of seed/wire, wound care, cosmetic deformity and the need for other treatments, death , blood clots, death. Pt agrees to  proceed.   Total time 45 minutes to review records, face-to-face time, Dr. dictation, review pathology, reviewed mammograms, review record documentation and discussed surgery with complications, long-term expectations and outcomes.  Current Plans You are being scheduled for surgery- Our schedulers will call you.  You should hear from our office's scheduling department within 5 working days about the location, date, and time of surgery. We try to make accommodations for patient's preferences in scheduling surgery,  but sometimes the OR schedule or the surgeon's schedule prevents [place] from making those accommodations.  If you have not heard from our office ([phone]) in 5 working days, call the office and ask for your surgeon's nurse.  If you have other questions about your diagnosis, plan, or surgery, call the office and ask for your surgeon's nurse.  Pt Education - [facility] Breast Biopsy HCI: discussed with patient and provided information.

## 2019-06-30 NOTE — Op Note (Signed)
Preoperative diagnosis: Left breast complex sclerosing lesion  Postoperative diagnosis: Same  Procedure: Left breast seed localized lumpectomy  Surgeon: Erroll Luna, MD  Anesthesia: General with 0.25% Marcaine plain  EBL: Minimal  Specimen: Left breast tissue with seed and clip verified by Faxitron oriented by ink and sent to pathology  Drains: None  IV fluids: Per anesthesia record  Indications for procedure: The patient presents for left breast lumpectomy due to a mammographic abnormality.  This was core biopsied and found to be a complex sclerosing lesion.  We discussed potential upgrade risk of up to 10 to 20% when these lesions are identified on core biopsy and recommend removal.  We discussed nonoperative management as well.  We discussed complications of surgery as well as downfall of nonoperative management.The procedure has been discussed with the patient. Alternatives to surgery have been discussed with the patient.  Risks of surgery include bleeding,  Infection,  Seroma formation, death,  and the need for further surgery.   The patient understands and wishes to proceed.  Description of procedure: The patient was met in the holding area and questions were answered.  Neoprobe used to identify seed in the left upper central breast.  She was then taken back to the operating room.  She is placed supine upon the OR table.  After induction of general esthesia left breast was prepped and draped in sterile fashion timeout was performed.  She received appropriate preoperative antibiotics.  Neoprobe used to identify the seed at about 12:00 above the nipple areolar complex.  Curvilinear incision was made above at the superior border of the nipple areolar complex.  Dissection was carried down all tissue and the seed and clip were excised with a grossly negative margin.  The Faxitron image was reviewed and both seed and clip were in the specimen.  This was inked and sent to pathology.  Cavity was  examined and made hemostatic with cautery.  It was then closed with a deep layer 3-0 Vicryl and 4-0 Monocryl.  Local anesthetic was infiltrated around the cavity.  Dermabond applied.  Breast binder placed.  All counts were found to be correct.  Patient was awoke extubated taken to recovery in satisfactory condition.

## 2019-07-01 ENCOUNTER — Encounter (HOSPITAL_BASED_OUTPATIENT_CLINIC_OR_DEPARTMENT_OTHER): Payer: Self-pay | Admitting: Surgery

## 2019-07-04 LAB — SURGICAL PATHOLOGY

## 2019-08-17 DIAGNOSIS — Z9889 Other specified postprocedural states: Secondary | ICD-10-CM | POA: Insufficient documentation

## 2019-11-17 DIAGNOSIS — Z90710 Acquired absence of both cervix and uterus: Secondary | ICD-10-CM | POA: Insufficient documentation

## 2020-07-16 ENCOUNTER — Encounter (HOSPITAL_COMMUNITY): Payer: Self-pay

## 2020-08-27 ENCOUNTER — Encounter: Payer: Self-pay | Admitting: Allergy and Immunology

## 2020-08-27 ENCOUNTER — Other Ambulatory Visit: Payer: Self-pay

## 2020-08-27 ENCOUNTER — Ambulatory Visit: Payer: BC Managed Care – PPO | Admitting: Allergy and Immunology

## 2020-08-27 VITALS — BP 118/72 | HR 97 | Resp 16 | Ht 63.0 in | Wt 188.0 lb

## 2020-08-27 DIAGNOSIS — L989 Disorder of the skin and subcutaneous tissue, unspecified: Secondary | ICD-10-CM

## 2020-08-27 DIAGNOSIS — J3089 Other allergic rhinitis: Secondary | ICD-10-CM | POA: Diagnosis not present

## 2020-08-27 MED ORDER — PIMECROLIMUS 1 % EX CREA
TOPICAL_CREAM | CUTANEOUS | 5 refills | Status: DC
Start: 2020-08-27 — End: 2020-09-26

## 2020-08-27 NOTE — Progress Notes (Signed)
[place] - [place] - [place]   Dear [place],  Thank you for referring ARYAM ZHAN to the [facility] [place] on 08/27/2020.   Below is a summation of this patient's evaluation and recommendations.  Thank you for your referral. I will keep you informed about this patient's response to treatment.   If you have any questions please do not hesitate to contact me.   Sincerely,  Jessica Priest, MD Allergy / Immunology [facility]   ______________________________________________________________________    NEW PATIENT NOTE  Referring Provider: Hal Morales, NP Primary Provider: Hal Morales, NP Date of office visit: 08/27/2020    Subjective:   Chief Complaint:  Felicia Roberson (DOB: [birth year]) is a 45 y.o. female who presents to the clinic on 08/27/2020 with a chief complaint of No chief complaint on file. Marland Kitchen     HPI: Theona presents to this clinic in evaluation of 2 issues.  First, for the past 5 months or so she has been having recurrent issues involving her eyelids affecting her right eyelid more than her left eyelid.  About 2-3 times per month she will get a red and swollen area for which she will use a topical steroid cream with some resolution of this issue within about 2 to 3 days.  She has no other associated systemic or constitutional symptoms.  There is no obvious provoking factor giving rise to this issue.  She thought initially this issue was secondary to the consumption of peanut M&Ms for if she had handfuls of peanut M&Ms for 2 to 3 days she would develop this problem but this issue has occurred multiple times without exposure to peanut M&Ms.  Second, she has a history of perennial nasal congestion and sneezing and red eyes and occasionally some coughing with exacerbation during the spring and fall especially following outdoor exposure.  She does not have  any anosmia or ugly nasal discharge although occasionally some of the sputum is a little bit tinged green.  She has a headache less than 1 time per month.  She takes Zyrtec-D daily.  She has tried [employer] but this has resulted in the development of a sore in her left nostril.  Past Medical History:  Diagnosis Date   Complication of anesthesia    hypotension and blurred vision x 1 week following hysterectomy   Headache(784.0)     Past Surgical History:  Procedure Laterality Date   ABDOMINAL HYSTERECTOMY     APPENDECTOMY     BREAST LUMPECTOMY WITH RADIOACTIVE SEED LOCALIZATION Left 06/30/2019   Procedure: LEFT BREAST LUMPECTOMY WITH RADIOACTIVE SEED LOCALIZATION;  Surgeon: Harriette Bouillon, MD;  Location: [facility];  Service: General;  Laterality: Left;   CESAREAN SECTION     WISDOM TOOTH EXTRACTION      Allergies as of 08/27/2020       Reactions   Codeine Nausea And Vomiting   Amoxicillin Rash        Medication List    cetirizine-pseudoephedrine 5-120 MG tablet Commonly known as: ZYRTEC-D Take 1 tablet by mouth 2 (two) times daily.   ONE-DAILY MULTI-VIT/MINERAL PO Take by mouth.   Troche Base Powd by Does not apply route.   Vitamin D3 125 MCG (5000 UT) Caps Take by mouth.   Zinc 40 MG Tabs Take by mouth.    Review of systems negative except as noted in HPI / PMHx  or noted below:  Review of Systems  Constitutional: Negative.   HENT: Negative.    Eyes: Negative.   Respiratory: Negative.    Cardiovascular: Negative.   Gastrointestinal: Negative.   Genitourinary: Negative.   Musculoskeletal: Negative.   Skin: Negative.   Neurological: Negative.   Endo/Heme/Allergies: Negative.   Psychiatric/Behavioral: Negative.     Family History  Problem Relation Age of Onset   Stroke Maternal Grandmother    Arthritis Mother    Hypertension Mother    Heart disease Father    COPD Father     Social History   Socioeconomic History   Marital status:  Married    Spouse name: Not on file   Number of children: Not on file   Years of education: Not on file   Highest education level: Not on file  Occupational History   Not on file  Tobacco Use   Smoking status: Never   Smokeless tobacco: Never  Vaping Use   Vaping Use: Never used  Substance and Sexual Activity   Alcohol use: No   Drug use: No   Sexual activity: Not Currently    Birth control/protection: Surgical  Other Topics Concern   Not on file  Social History Narrative   Not on file   Environmental and Social history  Lives in a house with a dry environment, no animals located inside the household, no carpet in the bedroom, no plastic on the bed, no plastic on the pillow, no smoking ongoing inside household.  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  Objective:   Vitals:   08/27/20 1429  BP: 118/72  Pulse: 97  Resp: 16  SpO2: 98%   Height: 5\' 3"  (160 cm) Weight: 188 lb (85.3 kg)  Physical Exam Constitutional:      Appearance: She is not diaphoretic.  HENT:     Head: Normocephalic.     Right Ear: Tympanic membrane, ear canal and external ear normal.     Left Ear: Tympanic membrane, ear canal and external ear normal.     Nose: Nose normal. No mucosal edema or rhinorrhea.     Mouth/Throat:     Pharynx: Uvula midline. No oropharyngeal exudate.  Eyes:     Conjunctiva/sclera: Conjunctivae normal.  Neck:     Thyroid: No thyromegaly.     Trachea: Trachea normal. No tracheal tenderness or tracheal deviation.  Cardiovascular:     Rate and Rhythm: Normal rate and regular rhythm.     Heart sounds: Normal heart sounds, S1 normal and S2 normal. No murmur heard. Pulmonary:     Effort: No respiratory distress.     Breath sounds: Normal breath sounds. No stridor. No wheezing or rales.  Lymphadenopathy:     Head:     Right side of head: No tonsillar adenopathy.     Left side of head: No tonsillar adenopathy.     Cervical: No cervical adenopathy.  Skin:     Findings: No erythema or rash.     Nails: There is no clubbing.  Neurological:     Mental Status: She is alert.    Diagnostics: Allergy skin tests were performed.  She did not demonstrate any hypersensitivity against a screening panel of aeroallergens or foods.  Assessment and Plan:    1. Perennial allergic rhinitis   2. Inflammatory dermatosis     1.  Allergen avoidance measures???  2.  Treat and prevent inflammation:  A. Rhinocort / Nasacort - 1-2 sprays each nostril 3-7  times per week B. Elidel - apply to eyelids 1-7 times per week  3. If needed:  A. Antihistamine B. Nasal saline C. Pataday - 1 drop each eye 1 time per day  4. Evaluation for contact dermatitis???  5. Return to clinic in 4 weeks or earlier if problem  6. Plan for fall flu vaccine  Judy appears to have some inflammation of both her airway and her skin although atopic disease may not be the sole trigger for this issue.  I will have her start a nasal steroid and hopefully she will tolerate the use of this agent and it will give rise to a decrease of her upper airway inflammation over the next several weeks.  As well she can use a calcineurin inhibitor for her eyelid eczema and she needs to find a dose that actually prevents the development of recurrent inflammation of her eyelids.  Should she still continue to have problems with her eyelids we will need to evaluate her for a contact dermatitis with patch testing.  I will see her back in this clinic in 4 weeks or earlier if there is a problem.  Jessica Priest, MD Allergy / Immunology [facility] [place]

## 2020-08-27 NOTE — Patient Instructions (Addendum)
  1.  Allergen avoidance measures  2.  Treat and prevent inflammation:  A. Rhinocort / Nasacort - 1-2 sprays each nostril 3-7 times per week B. Elidel - apply to eyelids 1-7 times per week  3. If needed:  A. Antihistamine B. Nasal saline C. Pataday - 1 drop each eye 1 time per day  4. Evaluation for contact dermatitis???  5. Return to clinic in 4 weeks or earlier if problem  6. Plan for fall flu vaccine

## 2020-08-28 ENCOUNTER — Encounter: Payer: Self-pay | Admitting: Allergy and Immunology

## 2020-09-26 ENCOUNTER — Other Ambulatory Visit: Payer: Self-pay

## 2020-09-26 ENCOUNTER — Encounter: Payer: Self-pay | Admitting: Allergy and Immunology

## 2020-09-26 ENCOUNTER — Ambulatory Visit: Payer: BC Managed Care – PPO | Admitting: Allergy and Immunology

## 2020-09-26 VITALS — BP 118/76 | HR 102 | Resp 16

## 2020-09-26 DIAGNOSIS — J3089 Other allergic rhinitis: Secondary | ICD-10-CM | POA: Diagnosis not present

## 2020-09-26 DIAGNOSIS — L989 Disorder of the skin and subcutaneous tissue, unspecified: Secondary | ICD-10-CM | POA: Diagnosis not present

## 2020-09-26 MED ORDER — PIMECROLIMUS 1 % EX CREA: TOPICAL_CREAM | CUTANEOUS | 5 refills | Status: DC

## 2020-09-26 NOTE — Patient Instructions (Addendum)
  1.  Treat and prevent inflammation:  A. Nasacort - 1-2 sprays each nostril 3-7 times per week B. Elidel or triamcinolone 0.1% ointment - to eyelids 1-7 times per week  2. If needed:  A. Antihistamine B. Nasal saline C. Pataday - 1 drop each eye 1 time per day  3. Evaluation for contact dermatitis???  4. Return to clinic in 1 year or earlier if problem  5. Plan for fall flu vaccine

## 2020-09-26 NOTE — Progress Notes (Signed)
[place] - [place] - [place] - [place] - [place]   Follow-up Note  Referring Provider: Hal Morales, NP Primary Provider: Hal Morales, NP Date of Office Visit: 09/26/2020  Subjective:   Felicia Roberson (DOB: 1975-01-07) is a 44 y.o. female who [facility] in re-evaluation of the following:  HPI: Felicia Roberson returns to this clinic in evaluation of rhinitis and apparent contact dermatitis.  Her last visit to this clinic was her initial evaluation of 27 August 2020.  She has noticed significant improvement while she has been consistently using a nasal steroid and a leukotriene modifier regarding her nasal congestion and sneezing.  She is able to tolerate Nasacort.  She did have some difficulty in acquiring Elidel as her insurance blocked this prescription and she is now using triamcinolone 0.1% ointment 1 or 2 times per week to her eyelids which is working quite well.  Allergies as of 09/26/2020       Reactions   Codeine Nausea And Vomiting   Amoxicillin Rash        Medication List    cetirizine-pseudoephedrine 5-120 MG tablet Commonly known as: ZYRTEC-D Take 1 tablet by mouth 2 (two) times daily.   ONE-DAILY MULTI-VIT/MINERAL PO Take by mouth.   Troche Base Powd by Does not apply route.   Vitamin D3 125 MCG (5000 UT) Caps Take by mouth.   Zinc 40 MG Tabs Take by mouth.    Past Medical History:  Diagnosis Date   Complication of anesthesia    hypotension and blurred vision x 1 week following hysterectomy   Headache(784.0)     Past Surgical History:  Procedure Laterality Date   ABDOMINAL HYSTERECTOMY     APPENDECTOMY     BREAST LUMPECTOMY WITH RADIOACTIVE SEED LOCALIZATION Left 06/30/2019   Procedure: LEFT BREAST LUMPECTOMY WITH RADIOACTIVE SEED LOCALIZATION;  Surgeon: Harriette Bouillon, MD;  Location: [facility];  Service: General;  Laterality: Left;   CESAREAN SECTION     WISDOM TOOTH EXTRACTION       Review of systems negative except as noted in HPI / PMHx or noted below:  Review of Systems  Constitutional: Negative.   HENT: Negative.    Eyes: Negative.   Respiratory: Negative.    Cardiovascular: Negative.   Gastrointestinal: Negative.   Genitourinary: Negative.   Musculoskeletal: Negative.   Skin: Negative.   Neurological: Negative.   Endo/Heme/Allergies: Negative.   Psychiatric/Behavioral: Negative.      Objective:   Vitals:   09/26/20 0828  BP: 118/76  Pulse: (!) 102  Resp: 16  SpO2: 97%          Physical Exam Constitutional:      Appearance: She is not diaphoretic.  HENT:     Head: Normocephalic.     Right Ear: Tympanic membrane, ear canal and external ear normal.     Left Ear: Tympanic membrane, ear canal and external ear normal.     Nose: Nose normal. No mucosal edema or rhinorrhea.     Mouth/Throat:     Pharynx: Uvula midline. No oropharyngeal exudate.  Eyes:     Conjunctiva/sclera: Conjunctivae normal.  Neck:     Thyroid: No thyromegaly.     Trachea: Trachea normal. No tracheal tenderness or tracheal deviation.  Cardiovascular:     Rate and Rhythm: Normal rate and regular rhythm.     Heart sounds: Normal heart sounds, S1 normal and S2 normal. No murmur heard. Pulmonary:  Effort: No respiratory distress.     Breath sounds: Normal breath sounds. No stridor. No wheezing or rales.  Lymphadenopathy:     Head:     Right side of head: No tonsillar adenopathy.     Left side of head: No tonsillar adenopathy.     Cervical: No cervical adenopathy.  Skin:    Findings: No erythema or rash.     Nails: There is no clubbing.  Neurological:     Mental Status: She is alert.    Diagnostics: none  Assessment and Plan:   1. Perennial allergic rhinitis   2. Inflammatory dermatosis     1.  Treat and prevent inflammation:  A. Nasacort - 1-2 sprays each nostril 3-7 times per week B. Elidel or triamcinolone 0.1% ointment - to eyelids 1-7 times per  week  2. If needed:  A. Antihistamine B. Nasal saline C. Pataday - 1 drop each eye 1 time per day  3. Evaluation for contact dermatitis???  4. Return to clinic in 1 year or earlier if problem  5. Plan for fall flu vaccine  Danyell appears to be doing quite well and she will continue to utilize a nasal steroid on a pretty regular basis and use some anti-inflammatory agents for her eyelids intermittently.  If she is using triamcinolone 0.1% ointment she should only use this topical agent 1 or 2 times per week.  If she is using Elidel she can use that topical agent more frequently.  Assuming she does well with this plan I will see her back in this clinic in 1 year or earlier if there is a problem.  At this point there does not appear to be any indication for further evaluation of contact dermatitis.  Felicia Schimke, MD Allergy / Immunology [name]

## 2020-09-27 ENCOUNTER — Encounter: Payer: Self-pay | Admitting: Allergy and Immunology

## 2020-12-04 IMAGING — DX MM BREAST SURGICAL SPECIMEN
1 series · 2 of 2 positions shown · non-contrast
Comparison: Previous exam(s).

CLINICAL DATA: Evaluate surgical specimen following excision of
LEFT breast complex sclerosing lesion.

EXAM:
SPECIMEN RADIOGRAPH OF THE LEFT BREAST

[Series 1: specimen digital x-ray · left · 0.10mm/px · 2 of 2 slices shown]
[im 1/2]
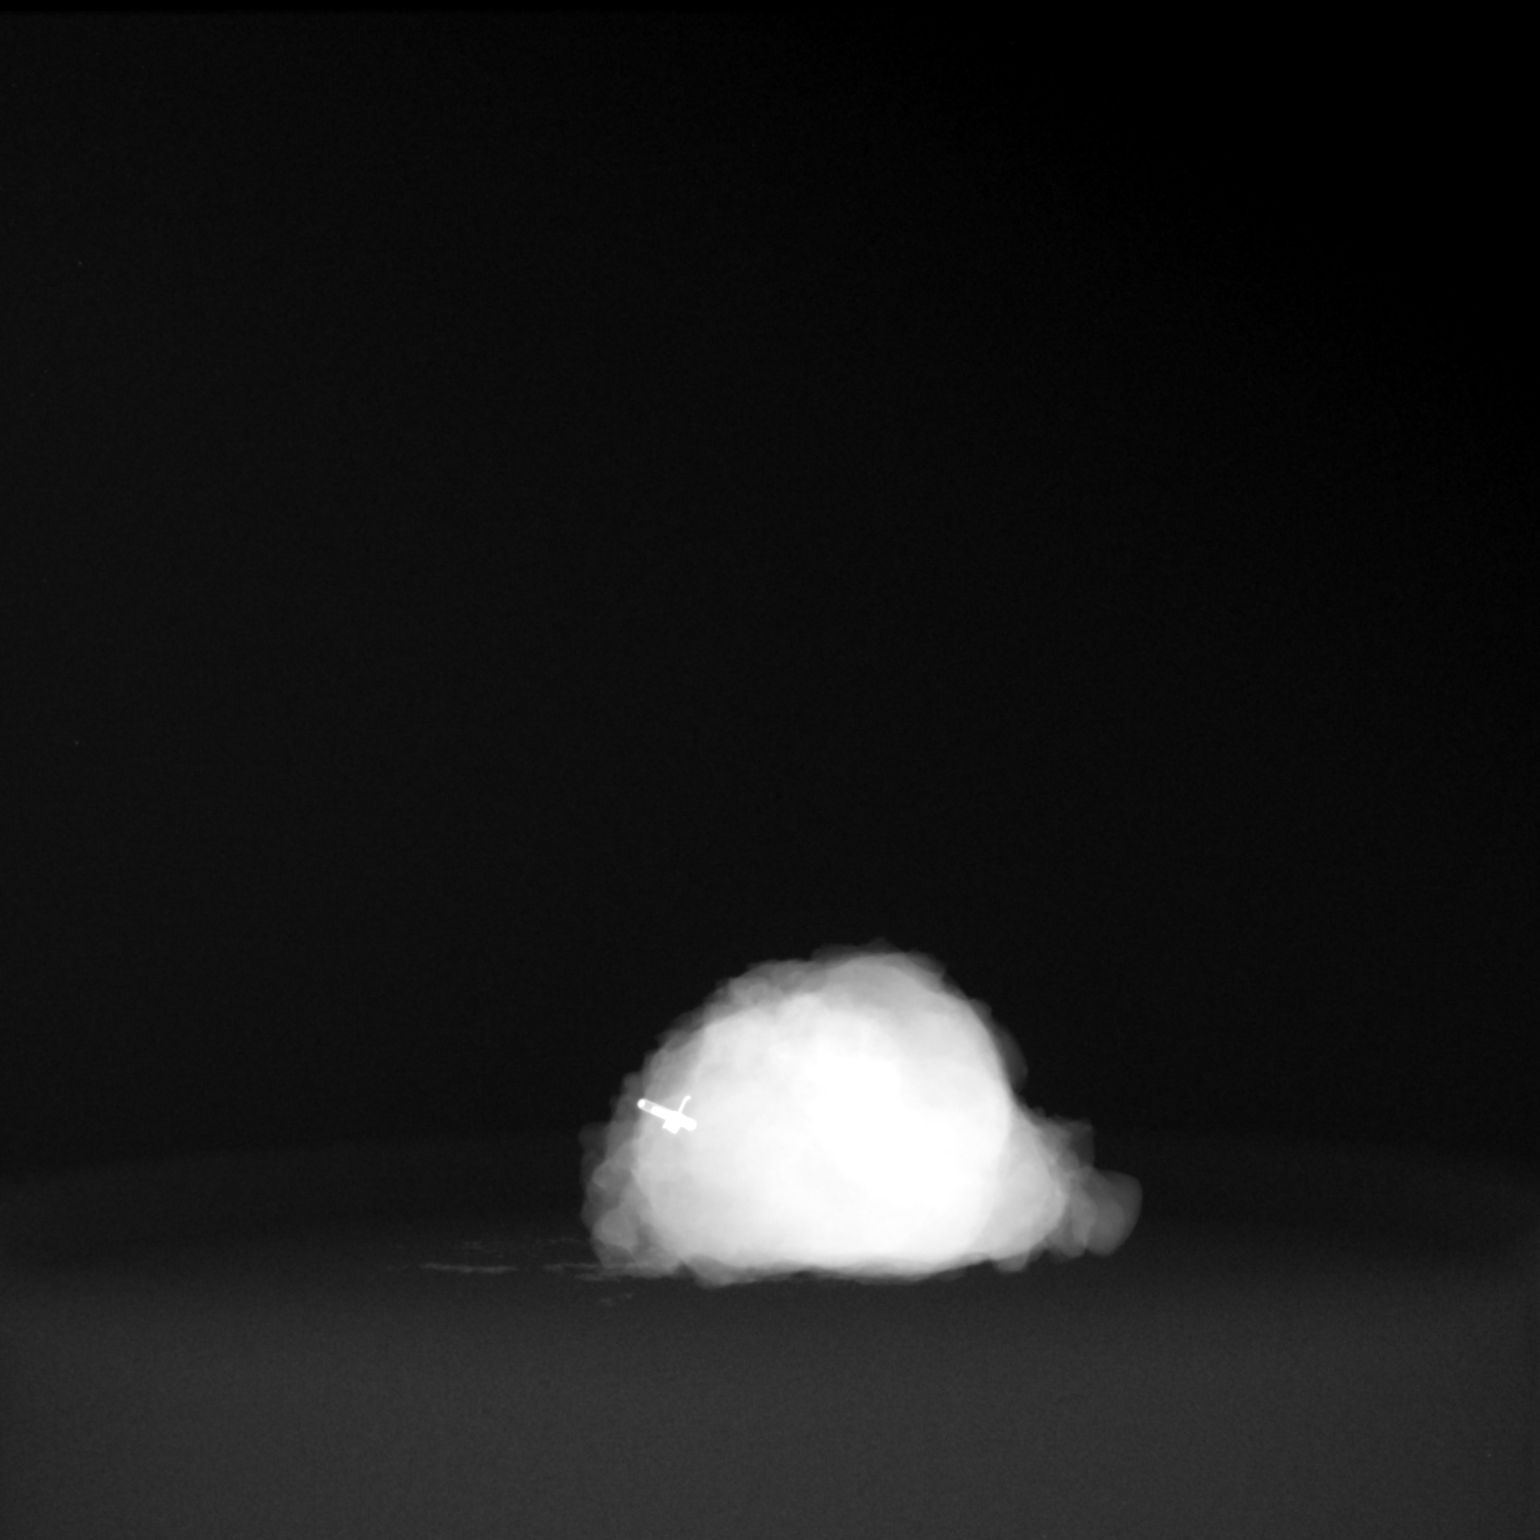
[im 2/2]
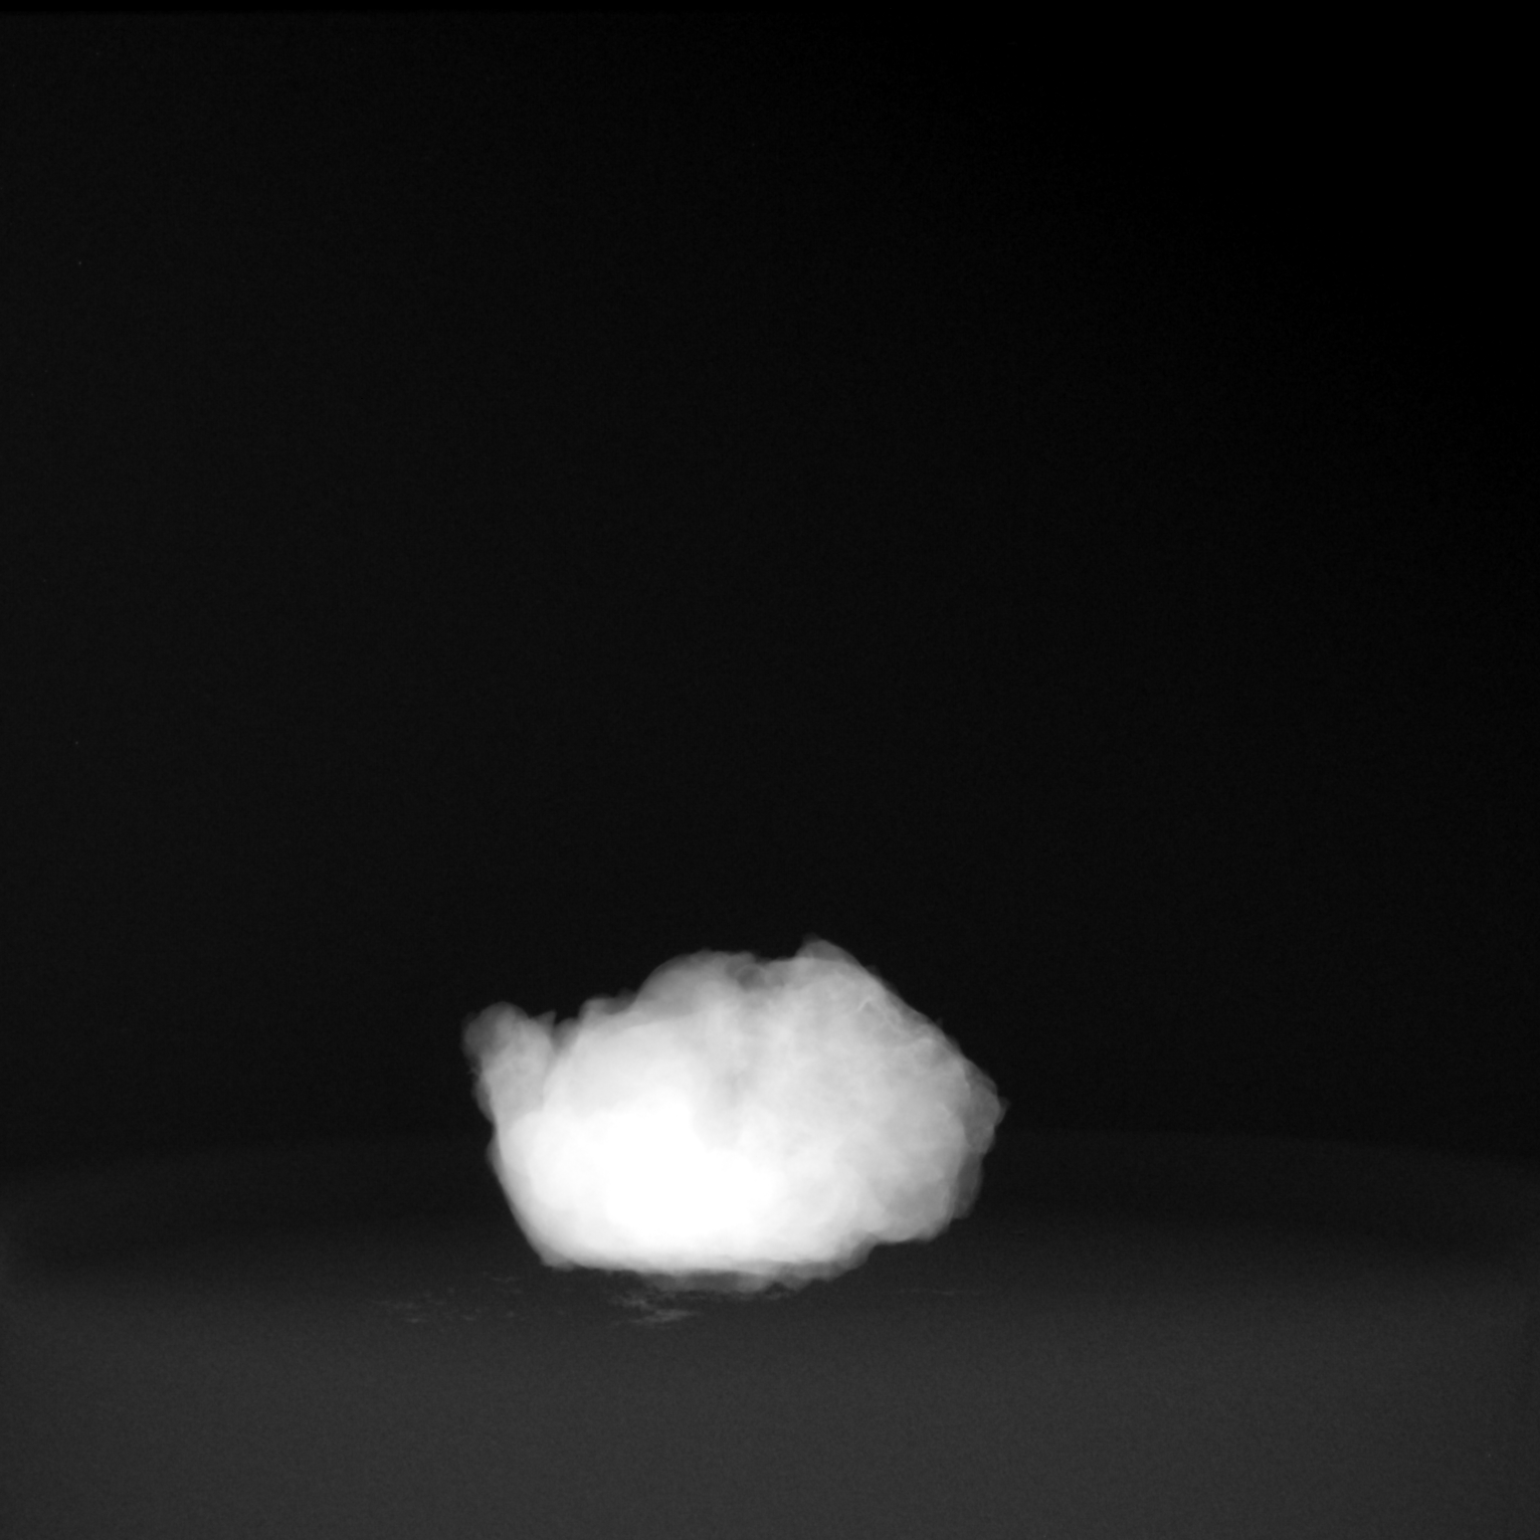

[2 of 2 positions shown; findings below may reference images not displayed]

FINDINGS: Status post excision of the LEFT breast. The radioactive seed and
biopsy marker clip are present and completely intact.
IMPRESSION: Specimen radiograph of the left breast.

## 2021-12-12 ENCOUNTER — Other Ambulatory Visit: Payer: Self-pay | Admitting: Obstetrics and Gynecology

## 2021-12-12 DIAGNOSIS — N6320 Unspecified lump in the left breast, unspecified quadrant: Secondary | ICD-10-CM

## 2021-12-25 ENCOUNTER — Other Ambulatory Visit: Payer: BC Managed Care – PPO

## 2022-01-01 ENCOUNTER — Ambulatory Visit
Admission: RE | Admit: 2022-01-01 | Discharge: 2022-01-01 | Disposition: A | Discharge status: Home / Self Care | Payer: BC Managed Care – PPO | Source: Ambulatory Visit | Attending: Obstetrics and Gynecology | Admitting: Obstetrics and Gynecology

## 2022-01-01 ENCOUNTER — Other Ambulatory Visit: Payer: Self-pay | Admitting: Obstetrics and Gynecology

## 2022-01-01 DIAGNOSIS — N6320 Unspecified lump in the left breast, unspecified quadrant: Secondary | ICD-10-CM

## 2022-01-01 DIAGNOSIS — N6002 Solitary cyst of left breast: Secondary | ICD-10-CM

## 2022-07-04 ENCOUNTER — Ambulatory Visit
Admission: RE | Admit: 2022-07-04 | Discharge: 2022-07-04 | Disposition: A | Discharge status: Home / Self Care | Payer: BC Managed Care – PPO | Source: Ambulatory Visit | Attending: Obstetrics and Gynecology | Admitting: Obstetrics and Gynecology

## 2022-07-04 DIAGNOSIS — N6002 Solitary cyst of left breast: Secondary | ICD-10-CM

## 2023-01-31 ENCOUNTER — Ambulatory Visit (HOSPITAL_BASED_OUTPATIENT_CLINIC_OR_DEPARTMENT_OTHER): Payer: Self-pay

## 2023-12-29 ENCOUNTER — Ambulatory Visit (INDEPENDENT_AMBULATORY_CARE_PROVIDER_SITE_OTHER): Admitting: Family Medicine

## 2023-12-29 ENCOUNTER — Encounter: Payer: Self-pay | Admitting: Family Medicine

## 2023-12-29 VITALS — BP 128/82 | HR 81 | Temp 98.1°F | Ht 63.0 in | Wt 188.6 lb

## 2023-12-29 DIAGNOSIS — K581 Irritable bowel syndrome with constipation: Secondary | ICD-10-CM | POA: Diagnosis not present

## 2023-12-29 DIAGNOSIS — Z6833 Body mass index (BMI) 33.0-33.9, adult: Secondary | ICD-10-CM

## 2023-12-29 DIAGNOSIS — T7840XA Allergy, unspecified, initial encounter: Secondary | ICD-10-CM | POA: Insufficient documentation

## 2023-12-29 DIAGNOSIS — Z1211 Encounter for screening for malignant neoplasm of colon: Secondary | ICD-10-CM | POA: Diagnosis not present

## 2023-12-29 DIAGNOSIS — E6609 Other obesity due to excess calories: Secondary | ICD-10-CM | POA: Diagnosis not present

## 2023-12-29 DIAGNOSIS — E66811 Obesity, class 1: Secondary | ICD-10-CM

## 2023-12-29 NOTE — Patient Instructions (Addendum)
" °  VISIT SUMMARY: Today, you came in for your initial visit to establish care and to have routine blood work done. We discussed your history of Crohn's disease, IBS, weight management, allergies, physical activity, and your current medications and supplements. You reported no current gastrointestinal, respiratory, or sleep issues. We also reviewed your recent gynecological and surgical history.  YOUR PLAN: -CLASS 1 OBESITY: Class 1 obesity means having a body mass index (BMI) between 30 and 34.9. You have been managing this with Wegovy, which has helped you lose 12 pounds so far. We will continue with Wegovy at 5 mg and monitor your leptin levels, as high leptin can make weight loss more difficult. You are encouraged to aim for gradual weight loss and maintenance.  -IRRITABLE BOWEL SYNDROME (IBS): IBS is a condition that affects the digestive system, causing symptoms like stomach cramps, bloating, diarrhea, and constipation. Your IBS with constipation is currently managed with Calm powder at night. Continue taking this supplement as it helps manage your symptoms.  -GENERAL HEALTH MAINTENANCE: We discussed routine health maintenance, including the importance of regular exercise and strength training. You prefer to have a colonoscopy rather than a Cologuard test to avoid the possibility of needing a follow-up colonoscopy. We have ordered basic labs, including a vitamin D  level, and referred you to a gastroenterologist to schedule a colonoscopy. Continue with your current vitamin D  supplementation.  INSTRUCTIONS: Please continue with your current medications and supplements as discussed. Follow up with the gastroenterologist to schedule your colonoscopy. We will monitor your leptin levels and continue with Wegovy at 5 mg. Maintain your exercise routine and aim for gradual weight loss. If you have any new symptoms or concerns, please contact our  office.                                          Contains text generated by [employer]bridge.   "

## 2023-12-29 NOTE — Assessment & Plan Note (Signed)
 General Health Maintenance Routine health maintenance discussed. Patient prefers colonoscopy to avoid the possibility of needing a colonoscopy after a positive Cologuard result. Ongoing vitamin D  supplementation. - Ordered basic labs including vitamin D  level. - Referred to GI for colonoscopy scheduling. - Encouraged regular exercise and strength training.

## 2023-12-29 NOTE — Assessment & Plan Note (Signed)
 Irritable bowel syndrome Constipation managed with Calm powder, exacerbated by Tzhncb. - Continue Calm powder at night.

## 2023-12-29 NOTE — Assessment & Plan Note (Signed)
 Class 1 obesity Managed with Wegovy, resulting in 12-pound weight loss. High leptin levels hinder weight loss. Goal to reduce leptin to 12-14. Aware of weight regain risk if Felicia Roberson is stopped. - Continue Wegovy at 5 mg. - Monitor leptin levels. - Encouraged gradual weight loss and maintenance.

## 2023-12-29 NOTE — Progress Notes (Signed)
 "  Subjective:  Patient ID: Felicia Roberson, female    DOB: May 13, 1975  Age: 48 y.o. MRN: [id number]  Chief Complaint  Patient presents with   Establish Care    HPI:  Patient is establishing as a new patient.  Discussed the use of AI scribe software for clinical note transcription with the patient, who gave verbal consent to proceed.          12/29/2023    7:58 AM  Depression screen PHQ 2/9  Decreased Interest 0  Down, Depressed, Hopeless 0  PHQ - 2 Score 0         06/30/2019    7:06 AM 12/29/2023    7:58 AM  Fall Risk  Falls in the past year?  0  Was there an injury with Fall?  0  Fall Risk Category Calculator  0  (RETIRED) Patient Fall Risk Level Low fall risk    Patient at Risk for Falls Due to  No Fall Risks  Fall risk Follow up  Falls evaluation completed     Data saved with a previous flowsheet row definition     Medications Ordered Prior to Encounter[1]. Social History   Socioeconomic History   Marital status: Married    Spouse name: Johnie   Number of children: 1   Years of education: Not on file   Highest education level: Associate degree: occupational, scientist, product/process development, or vocational program  Occupational History   Not on file  Tobacco Use   Smoking status: Never   Smokeless tobacco: Never  Vaping Use   Vaping status: Never Used  Substance and Sexual Activity   Alcohol use: Never   Drug use: Never   Sexual activity: Yes    Partners: Male    Birth control/protection: Surgical  Other Topics Concern   Not on file  Social History Narrative   Not on file   Social Drivers of Health   Tobacco Use: Low Risk (12/29/2023)   Patient History    Smoking Tobacco Use: Never    Smokeless Tobacco Use: Never    Passive Exposure: Not on file  Financial Resource Strain: Low Risk (12/25/2023)   Overall Financial Resource Strain (CARDIA)    Difficulty of Paying Living Expenses: Not hard at all  Food Insecurity: No Food Insecurity (12/25/2023)   Epic     Worried About Programme Researcher, Broadcasting/film/video in the Last Year: Never true    Ran Out of Food in the Last Year: Never true  Transportation Needs: No Transportation Needs (12/25/2023)   Epic    Lack of Transportation (Medical): No    Lack of Transportation (Non-Medical): No  Physical Activity: Insufficiently Active (12/25/2023)   Exercise Vital Sign    Days of Exercise per Week: 4 days    Minutes of Exercise per Session: 30 min  Stress: No Stress Concern Present (12/25/2023)   [employer] of Occupational Health - Occupational Stress Questionnaire    Feeling of Stress: Not at all  Social Connections: Moderately Integrated (12/25/2023)   Social Connection and Isolation Panel    Frequency of Communication with Friends and Family: More than three times a week    Frequency of Social Gatherings with Friends and Family: More than three times a week    Attends Religious Services: More than 4 times per year    Active Member of [employer] or Organizations: No    Attends Banker Meetings: Not on file    Marital Status: Married  Depression (EYV7-0): Low Risk (  12/29/2023)   Depression (PHQ2-9)    PHQ-2 Score: 0  Alcohol Screen: Low Risk (12/29/2023)   Alcohol Screen    Last Alcohol Screening Score (AUDIT): 0  Housing: Low Risk (12/25/2023)   Epic    Unable to Pay for Housing in the Last Year: No    Number of Times Moved in the Last Year: 0    Homeless in the Last Year: No  Utilities: Not At Risk (12/29/2023)   Epic    Threatened with loss of utilities: No  Health Literacy: Adequate Health Literacy (12/29/2023)   B1300 Health Literacy    Frequency of need for help with medical instructions: Never   Past Medical History:  Diagnosis Date   Allergy    Complication of anesthesia    hypotension and blurred vision x 1 week following hysterectomy   Depression    GERD (gastroesophageal reflux disease)    Headache(784.0)    Family History  Problem Relation Age of Onset   Arthritis Mother     Hypertension Mother    Heart disease Father    COPD Father    Stroke Maternal Grandmother    Kidney disease Paternal Grandmother     Review of Systems  Constitutional:  Negative for chills, fatigue and fever.  HENT:  Negative for congestion, ear pain, sinus pressure and sore throat.   Respiratory:  Negative for cough and shortness of breath.   Cardiovascular:  Negative for chest pain.  Gastrointestinal:  Negative for abdominal pain, constipation, diarrhea, nausea and vomiting.  Genitourinary:  Negative for dysuria and frequency.  Musculoskeletal:  Negative for arthralgias, back pain and myalgias.  Neurological:  Negative for dizziness and headaches.  Psychiatric/Behavioral:  Negative for dysphoric mood and sleep disturbance. The patient is not nervous/anxious.      Objective:  BP 128/82   Pulse 81   Temp 98.1 F (36.7 C)   Ht 5' 3 (1.6 m)   Wt 188 lb 9.6 oz (85.5 kg)   SpO2 96%   BMI 33.41 kg/m      12/29/2023    7:41 AM 09/26/2020    8:28 AM 08/27/2020    2:29 PM  BP/Weight  Systolic BP 128 118 118  Diastolic BP 82 76 72  Wt. (Lbs) 188.6  188  BMI 33.41 kg/m2  33.3 kg/m2    Physical Exam Vitals reviewed.  Constitutional:      General: She is not in acute distress.    Appearance: Normal appearance. She is obese. She is not ill-appearing.  HENT:     Nose: No congestion.  Eyes:     Conjunctiva/sclera: Conjunctivae normal.  Cardiovascular:     Rate and Rhythm: Normal rate and regular rhythm.     Heart sounds: Normal heart sounds. No murmur heard. Pulmonary:     Effort: Pulmonary effort is normal. No respiratory distress.     Breath sounds: Normal breath sounds.  Abdominal:     Palpations: Abdomen is soft.  Musculoskeletal:        General: Normal range of motion.  Skin:    General: Skin is warm.  Neurological:     Mental Status: She is alert and oriented to person, place, and time. Mental status is at baseline.  Psychiatric:        Mood and Affect:  Mood normal.        Behavior: Behavior normal.     Lab Results  Component Value Date   WBC 6.3 06/27/2019   HGB 14.3 06/27/2019  HCT 43.1 06/27/2019   PLT 235 06/27/2019   GLUCOSE 100 (H) 06/27/2019   ALT 55 (H) 06/27/2019   AST 24 06/27/2019   NA 140 06/27/2019   K 4.2 06/27/2019   CL 106 06/27/2019   CREATININE 0.88 06/27/2019   BUN 16 06/27/2019   CO2 27 06/27/2019      Assessment & Plan:  Irritable bowel syndrome with constipation Assessment & Plan: Irritable bowel syndrome Constipation managed with Calm powder, exacerbated by Tzhncb. - Continue Calm powder at night.   Class 1 obesity due to excess calories without serious comorbidity with body mass index (BMI) of 33.0 to 33.9 in adult Assessment & Plan: Class 1 obesity Managed with Wegovy, resulting in 12-pound weight loss. High leptin levels hinder weight loss. Goal to reduce leptin to 12-14. Aware of weight regain risk if Georjean is stopped. - Continue Wegovy at 5 mg. - Monitor leptin levels. - Encouraged gradual weight loss and maintenance.  Orders: -     CBC with Differential/Platelet -     Comprehensive metabolic panel with GFR -     Lipid panel -     VITAMIN D  25 Hydroxy (Vit-D Deficiency, Fractures)  Screen for colon cancer Assessment & Plan: General Health Maintenance Routine health maintenance discussed. Patient prefers colonoscopy to avoid the possibility of needing a colonoscopy after a positive Cologuard result. Ongoing vitamin D  supplementation. - Ordered basic labs including vitamin D  level. - Referred to GI for colonoscopy scheduling. - Encouraged regular exercise and strength training.      Orders: -     Ambulatory referral to Gastroenterology      No orders of the defined types were placed in this encounter.  Orders Placed This Encounter  Procedures   CBC with Differential   Comprehensive metabolic panel with GFR   Lipid Panel   Vitamin D , 25-hydroxy   Ambulatory referral to  Gastroenterology       Follow-up: Return in about 6 months (around 06/28/2024) for Annual Physical.  AVS was given to patient prior to departure.  Harrie Cedar, FNP [facility] [phone]      [1]  Current Outpatient Medications on File Prior to Visit  Medication Sig Dispense Refill   cetirizine-pseudoephedrine (ZYRTEC-D) 5-120 MG tablet Take 1 tablet by mouth 2 (two) times daily.     Cholecalciferol (VITAMIN D3) 125 MCG (5000 UT) CAPS Take by mouth.     Esterified Estrogens 2.5 MG TABS Take 2.5 mg by mouth daily.     estradiol (ESTRACE) 0.01 % CREA vaginal cream Place 1 Applicatorful vaginally 3 (three) times a week.     famotidine (PEPCID) 20 MG tablet Take 20 mg by mouth 2 (two) times daily.     GLUTATHIONE PO Take 5 mLs by mouth daily. trizomal     Olopatadine HCl (PAZEO) 0.7 % SOLN Place 1 drop into both eyes 2 (two) times daily as needed.     OVER THE COUNTER MEDICATION Take 1 capsule by mouth daily. Mara guard     OVER THE COUNTER MEDICATION Take 2 capsules by mouth daily. Liver G.I Detox     OVER THE COUNTER MEDICATION Place 2 sprays into the nose as needed. Argenity     Probiotic Product (PROBIOTIC DAILY PO) Take 2 tablets by mouth daily.     PROGESTERONE PO Take 225 mg by mouth daily.     Semaglutide-Weight Management (WEGOVY [place]) Inject 5 mg into the skin once a week.     Turmeric (QC TUMERIC  COMPLEX PO) Take 1,000 mg by mouth daily.     No current facility-administered medications on file prior to visit.   "

## 2023-12-30 LAB — CBC WITH DIFFERENTIAL/PLATELET
Basophils Absolute: 0 x10E3/uL (ref 0.0–0.2)
Basos: 1 %
EOS (ABSOLUTE): 0.2 x10E3/uL (ref 0.0–0.4)
Eos: 3 %
Hematocrit: 45.7 % (ref 34.0–46.6)
Hemoglobin: 15.3 g/dL (ref 11.1–15.9)
Immature Grans (Abs): 0 x10E3/uL (ref 0.0–0.1)
Immature Granulocytes: 0 %
Lymphocytes Absolute: 2.2 x10E3/uL (ref 0.7–3.1)
Lymphs: 30 %
MCH: 33 pg (ref 26.6–33.0)
MCHC: 33.5 g/dL (ref 31.5–35.7)
MCV: 99 fL — ABNORMAL HIGH (ref 79–97)
Monocytes Absolute: 0.6 x10E3/uL (ref 0.1–0.9)
Monocytes: 8 %
Neutrophils Absolute: 4.2 x10E3/uL (ref 1.4–7.0)
Neutrophils: 58 %
Platelets: 271 x10E3/uL (ref 150–450)
RBC: 4.64 x10E6/uL (ref 3.77–5.28)
RDW: 11.6 % — ABNORMAL LOW (ref 11.7–15.4)
WBC: 7.2 x10E3/uL (ref 3.4–10.8)

## 2023-12-30 LAB — COMPREHENSIVE METABOLIC PANEL WITH GFR
ALT: 27 IU/L (ref 0–32)
AST: 16 IU/L (ref 0–40)
Albumin: 4.5 g/dL (ref 3.9–4.9)
Alkaline Phosphatase: 69 IU/L (ref 41–116)
BUN/Creatinine Ratio: 19 (ref 9–23)
BUN: 19 mg/dL (ref 6–24)
Bilirubin Total: 0.3 mg/dL (ref 0.0–1.2)
CO2: 24 mmol/L (ref 20–29)
Calcium: 9.6 mg/dL (ref 8.7–10.2)
Chloride: 103 mmol/L (ref 96–106)
Creatinine, Ser: 0.99 mg/dL (ref 0.57–1.00)
Globulin, Total: 2.2 g/dL (ref 1.5–4.5)
Glucose: 78 mg/dL (ref 70–99)
Potassium: 5 mmol/L (ref 3.5–5.2)
Sodium: 141 mmol/L (ref 134–144)
Total Protein: 6.7 g/dL (ref 6.0–8.5)
eGFR: 70 mL/min/1.73

## 2023-12-30 LAB — LIPID PANEL
Chol/HDL Ratio: 2.7 ratio (ref 0.0–4.4)
Cholesterol, Total: 163 mg/dL (ref 100–199)
HDL: 61 mg/dL
LDL Chol Calc (NIH): 90 mg/dL (ref 0–99)
Triglycerides: 61 mg/dL (ref 0–149)
VLDL Cholesterol Cal: 12 mg/dL (ref 5–40)

## 2023-12-30 LAB — VITAMIN D 25 HYDROXY (VIT D DEFICIENCY, FRACTURES): Vit D, 25-Hydroxy: 62.5 ng/mL (ref 30.0–100.0)

## 2023-12-31 ENCOUNTER — Ambulatory Visit: Payer: Self-pay | Admitting: Family Medicine

## 2024-02-11 ENCOUNTER — Telehealth: Payer: Self-pay | Admitting: Family Medicine

## 2024-02-11 DIAGNOSIS — K219 Gastro-esophageal reflux disease without esophagitis: Secondary | ICD-10-CM

## 2024-02-11 MED ORDER — FAMOTIDINE 20 MG PO TABS: 20.0000 mg | ORAL_TABLET | Freq: Two times a day (BID) | ORAL | 2 refills | Status: AC

## 2024-02-11 NOTE — Telephone Encounter (Signed)
 Copied from CRM [id number]. Topic: Clinical - Medication Refill >> Feb 11, 2024  1:33 PM Berneda F wrote: Medication: famotidine  (PEPCID ) 20 MG tablet  Please note, this was previously prescribed by prior PCP  3 month supply if possible  Has the patient contacted their pharmacy? No, they said it was from an old PCP (Agent: If no, request that the patient contact the pharmacy for the refill. If patient does not wish to contact the pharmacy document the reason why and proceed with request.) (Agent: If yes, when and what did the pharmacy advise?)  This is the patient's preferred pharmacy:  [facility] [address], [place] - [address] [place] [address] Phone: [phone] Fax: [phone]  Is this the correct pharmacy for this prescription? Yes If no, delete pharmacy and type the correct one.   Has the prescription been filled recently? No  Is the patient out of the medication? No  Has the patient been seen for an appointment in the last year OR does the patient have an upcoming appointment? Yes  Can we respond through MyChart? Yes  Agent: Please be advised that Rx refills may take up to 3 business days. We ask that you follow-up with your pharmacy.
